# Patient Record
Sex: Female | Born: 1935 | Race: White | Hispanic: No | Smoking: Never smoker
Health system: Southern US, Community
[De-identification: ages and names within clinical notes are randomized; demographics above are authoritative.]

## PROBLEM LIST (undated history)

## (undated) DIAGNOSIS — E785 Hyperlipidemia, unspecified: Secondary | ICD-10-CM

## (undated) DIAGNOSIS — E46 Unspecified protein-calorie malnutrition: Secondary | ICD-10-CM

## (undated) DIAGNOSIS — C539 Malignant neoplasm of cervix uteri, unspecified: Secondary | ICD-10-CM

## (undated) DIAGNOSIS — K219 Gastro-esophageal reflux disease without esophagitis: Secondary | ICD-10-CM

## (undated) DIAGNOSIS — R768 Other specified abnormal immunological findings in serum: Secondary | ICD-10-CM

## (undated) DIAGNOSIS — E538 Deficiency of other specified B group vitamins: Secondary | ICD-10-CM

## (undated) DIAGNOSIS — F329 Major depressive disorder, single episode, unspecified: Secondary | ICD-10-CM

## (undated) DIAGNOSIS — K520 Gastroenteritis and colitis due to radiation: Secondary | ICD-10-CM

## (undated) DIAGNOSIS — G629 Polyneuropathy, unspecified: Principal | ICD-10-CM

## (undated) DIAGNOSIS — M810 Age-related osteoporosis without current pathological fracture: Secondary | ICD-10-CM

## (undated) DIAGNOSIS — D649 Anemia, unspecified: Secondary | ICD-10-CM

## (undated) DIAGNOSIS — F411 Generalized anxiety disorder: Secondary | ICD-10-CM

## (undated) DIAGNOSIS — N959 Unspecified menopausal and perimenopausal disorder: Secondary | ICD-10-CM

## (undated) DIAGNOSIS — K589 Irritable bowel syndrome without diarrhea: Secondary | ICD-10-CM

## (undated) DIAGNOSIS — Z8541 Personal history of malignant neoplasm of cervix uteri: Secondary | ICD-10-CM

## (undated) DIAGNOSIS — M5126 Other intervertebral disc displacement, lumbar region: Secondary | ICD-10-CM

## (undated) DIAGNOSIS — E739 Lactose intolerance, unspecified: Secondary | ICD-10-CM

## (undated) DIAGNOSIS — R778 Other specified abnormalities of plasma proteins: Principal | ICD-10-CM

## (undated) DIAGNOSIS — J309 Allergic rhinitis, unspecified: Secondary | ICD-10-CM

## (undated) DIAGNOSIS — F3289 Other specified depressive episodes: Secondary | ICD-10-CM

## (undated) DIAGNOSIS — M19079 Primary osteoarthritis, unspecified ankle and foot: Secondary | ICD-10-CM

## (undated) HISTORY — DX: Age-related osteoporosis without current pathological fracture: M81.0

## (undated) HISTORY — DX: Primary osteoarthritis, unspecified ankle and foot: M19.079

## (undated) HISTORY — DX: Personal history of malignant neoplasm of cervix uteri: Z85.41

## (undated) HISTORY — PX: ABDOMINAL HYSTERECTOMY: SHX81

## (undated) HISTORY — DX: Other specified depressive episodes: F32.89

## (undated) HISTORY — DX: Gastroenteritis and colitis due to radiation: K52.0

## (undated) HISTORY — DX: Allergic rhinitis, unspecified: J30.9

## (undated) HISTORY — DX: Anemia, unspecified: D64.9

## (undated) HISTORY — DX: Lactose intolerance, unspecified: E73.9

## (undated) HISTORY — PX: OTHER SURGICAL HISTORY: SHX169

## (undated) HISTORY — DX: Unspecified protein-calorie malnutrition: E46

## (undated) HISTORY — DX: Generalized anxiety disorder: F41.1

## (undated) HISTORY — DX: Other intervertebral disc displacement, lumbar region: M51.26

## (undated) HISTORY — DX: Other specified abnormal immunological findings in serum: R76.8

## (undated) HISTORY — DX: Deficiency of other specified B group vitamins: E53.8

## (undated) HISTORY — DX: Irritable bowel syndrome, unspecified: K58.9

## (undated) HISTORY — DX: Malignant neoplasm of cervix uteri, unspecified: C53.9

## (undated) HISTORY — PX: APPENDECTOMY: SHX54

## (undated) HISTORY — DX: Other specified abnormalities of plasma proteins: R77.8

## (undated) HISTORY — DX: Hyperlipidemia, unspecified: E78.5

## (undated) HISTORY — DX: Unspecified menopausal and perimenopausal disorder: N95.9

## (undated) HISTORY — DX: Major depressive disorder, single episode, unspecified: F32.9

## (undated) HISTORY — DX: Polyneuropathy, unspecified: G62.9

## (undated) HISTORY — DX: Gastro-esophageal reflux disease without esophagitis: K21.9

## (undated) HISTORY — PX: OOPHORECTOMY: SHX86

---

## 1998-04-02 ENCOUNTER — Ambulatory Visit (HOSPITAL_COMMUNITY): Admission: RE | Admit: 1998-04-02 | Discharge: 1998-04-02 | Payer: Self-pay | Admitting: Gastroenterology

## 1999-02-04 ENCOUNTER — Ambulatory Visit (HOSPITAL_COMMUNITY): Admission: RE | Admit: 1999-02-04 | Discharge: 1999-02-04 | Payer: Self-pay | Admitting: *Deleted

## 1999-04-23 ENCOUNTER — Ambulatory Visit (HOSPITAL_COMMUNITY): Admission: RE | Admit: 1999-04-23 | Discharge: 1999-04-23 | Payer: Self-pay | Admitting: Pediatrics

## 1999-04-23 ENCOUNTER — Encounter: Payer: Self-pay | Admitting: *Deleted

## 1999-05-05 ENCOUNTER — Encounter: Payer: Self-pay | Admitting: *Deleted

## 1999-05-05 ENCOUNTER — Ambulatory Visit (HOSPITAL_COMMUNITY): Admission: RE | Admit: 1999-05-05 | Discharge: 1999-05-05 | Payer: Self-pay | Admitting: *Deleted

## 1999-11-09 ENCOUNTER — Emergency Department (HOSPITAL_COMMUNITY): Admission: EM | Admit: 1999-11-09 | Discharge: 1999-11-09 | Payer: Self-pay | Admitting: Emergency Medicine

## 2000-02-29 ENCOUNTER — Encounter: Payer: Self-pay | Admitting: *Deleted

## 2000-02-29 ENCOUNTER — Ambulatory Visit (HOSPITAL_COMMUNITY): Admission: RE | Admit: 2000-02-29 | Discharge: 2000-02-29 | Payer: Self-pay | Admitting: *Deleted

## 2000-03-25 ENCOUNTER — Ambulatory Visit (HOSPITAL_COMMUNITY): Admission: RE | Admit: 2000-03-25 | Discharge: 2000-03-25 | Payer: Self-pay | Admitting: *Deleted

## 2000-03-25 ENCOUNTER — Encounter: Payer: Self-pay | Admitting: *Deleted

## 2000-03-30 ENCOUNTER — Encounter: Payer: Self-pay | Admitting: *Deleted

## 2000-03-30 ENCOUNTER — Ambulatory Visit (HOSPITAL_COMMUNITY): Admission: RE | Admit: 2000-03-30 | Discharge: 2000-03-30 | Payer: Self-pay | Admitting: *Deleted

## 2000-06-27 ENCOUNTER — Inpatient Hospital Stay (HOSPITAL_COMMUNITY): Admission: EM | Admit: 2000-06-27 | Discharge: 2000-07-22 | Payer: Self-pay | Admitting: Gastroenterology

## 2000-06-27 ENCOUNTER — Encounter: Payer: Self-pay | Admitting: Gastroenterology

## 2000-06-27 ENCOUNTER — Encounter (INDEPENDENT_AMBULATORY_CARE_PROVIDER_SITE_OTHER): Payer: Self-pay | Admitting: Specialist

## 2000-06-27 LAB — HM COLONOSCOPY

## 2000-06-28 ENCOUNTER — Encounter: Payer: Self-pay | Admitting: Gastroenterology

## 2000-07-01 ENCOUNTER — Encounter: Payer: Self-pay | Admitting: Internal Medicine

## 2000-07-06 ENCOUNTER — Encounter: Payer: Self-pay | Admitting: Gastroenterology

## 2000-07-08 ENCOUNTER — Encounter: Payer: Self-pay | Admitting: Gastroenterology

## 2000-07-09 ENCOUNTER — Encounter: Payer: Self-pay | Admitting: Gastroenterology

## 2000-07-10 ENCOUNTER — Encounter: Payer: Self-pay | Admitting: Gastroenterology

## 2000-07-11 ENCOUNTER — Encounter: Payer: Self-pay | Admitting: Orthopedic Surgery

## 2000-07-12 ENCOUNTER — Encounter: Payer: Self-pay | Admitting: Endocrinology

## 2000-07-14 ENCOUNTER — Encounter: Payer: Self-pay | Admitting: Endocrinology

## 2000-07-18 ENCOUNTER — Encounter: Payer: Self-pay | Admitting: Internal Medicine

## 2000-07-20 ENCOUNTER — Encounter: Payer: Self-pay | Admitting: Internal Medicine

## 2000-07-22 ENCOUNTER — Inpatient Hospital Stay
Admission: RE | Admit: 2000-07-22 | Discharge: 2000-08-09 | Payer: Self-pay | Admitting: Physical Medicine & Rehabilitation

## 2000-07-28 ENCOUNTER — Encounter: Payer: Self-pay | Admitting: Internal Medicine

## 2000-07-29 ENCOUNTER — Encounter: Payer: Self-pay | Admitting: Internal Medicine

## 2000-09-20 HISTORY — PX: OTHER SURGICAL HISTORY: SHX169

## 2001-01-02 ENCOUNTER — Inpatient Hospital Stay (HOSPITAL_COMMUNITY): Admission: EM | Admit: 2001-01-02 | Discharge: 2001-01-24 | Payer: Self-pay | Admitting: Gastroenterology

## 2001-01-02 ENCOUNTER — Encounter (INDEPENDENT_AMBULATORY_CARE_PROVIDER_SITE_OTHER): Payer: Self-pay

## 2001-01-02 ENCOUNTER — Encounter: Payer: Self-pay | Admitting: Gastroenterology

## 2001-01-03 ENCOUNTER — Encounter: Payer: Self-pay | Admitting: Gastroenterology

## 2001-01-08 ENCOUNTER — Encounter: Payer: Self-pay | Admitting: Gastroenterology

## 2001-01-09 ENCOUNTER — Encounter: Payer: Self-pay | Admitting: Internal Medicine

## 2001-01-10 ENCOUNTER — Encounter: Payer: Self-pay | Admitting: Internal Medicine

## 2001-01-11 ENCOUNTER — Encounter: Payer: Self-pay | Admitting: Internal Medicine

## 2001-01-12 ENCOUNTER — Encounter: Payer: Self-pay | Admitting: Gastroenterology

## 2001-01-24 ENCOUNTER — Inpatient Hospital Stay: Admission: RE | Admit: 2001-01-24 | Discharge: 2001-01-30 | Payer: Self-pay

## 2005-04-09 ENCOUNTER — Ambulatory Visit: Payer: Self-pay | Admitting: Endocrinology

## 2006-05-13 ENCOUNTER — Ambulatory Visit: Payer: Self-pay | Admitting: Endocrinology

## 2006-05-18 ENCOUNTER — Ambulatory Visit: Payer: Self-pay | Admitting: Internal Medicine

## 2006-07-04 ENCOUNTER — Ambulatory Visit: Payer: Self-pay | Admitting: Gastroenterology

## 2006-07-06 ENCOUNTER — Ambulatory Visit (HOSPITAL_COMMUNITY): Admission: RE | Admit: 2006-07-06 | Discharge: 2006-07-06 | Payer: Self-pay | Admitting: Gastroenterology

## 2006-07-25 ENCOUNTER — Ambulatory Visit: Payer: Self-pay | Admitting: Internal Medicine

## 2006-10-27 ENCOUNTER — Ambulatory Visit: Payer: Self-pay | Admitting: Internal Medicine

## 2007-06-14 ENCOUNTER — Encounter: Payer: Self-pay | Admitting: *Deleted

## 2007-06-14 DIAGNOSIS — K589 Irritable bowel syndrome without diarrhea: Secondary | ICD-10-CM | POA: Insufficient documentation

## 2007-06-14 DIAGNOSIS — K219 Gastro-esophageal reflux disease without esophagitis: Secondary | ICD-10-CM

## 2007-06-14 DIAGNOSIS — F411 Generalized anxiety disorder: Secondary | ICD-10-CM | POA: Insufficient documentation

## 2007-06-14 DIAGNOSIS — K520 Gastroenteritis and colitis due to radiation: Secondary | ICD-10-CM | POA: Insufficient documentation

## 2007-06-14 DIAGNOSIS — F329 Major depressive disorder, single episode, unspecified: Secondary | ICD-10-CM

## 2007-06-14 DIAGNOSIS — Z8541 Personal history of malignant neoplasm of cervix uteri: Secondary | ICD-10-CM | POA: Insufficient documentation

## 2007-06-14 DIAGNOSIS — E46 Unspecified protein-calorie malnutrition: Secondary | ICD-10-CM | POA: Insufficient documentation

## 2007-10-12 ENCOUNTER — Ambulatory Visit: Payer: Self-pay | Admitting: Internal Medicine

## 2007-10-12 DIAGNOSIS — D649 Anemia, unspecified: Secondary | ICD-10-CM

## 2007-10-12 DIAGNOSIS — J019 Acute sinusitis, unspecified: Secondary | ICD-10-CM

## 2007-10-12 DIAGNOSIS — M810 Age-related osteoporosis without current pathological fracture: Secondary | ICD-10-CM | POA: Insufficient documentation

## 2007-10-12 DIAGNOSIS — E739 Lactose intolerance, unspecified: Secondary | ICD-10-CM | POA: Insufficient documentation

## 2007-10-12 DIAGNOSIS — R5381 Other malaise: Secondary | ICD-10-CM | POA: Insufficient documentation

## 2007-10-12 DIAGNOSIS — R5383 Other fatigue: Secondary | ICD-10-CM

## 2007-10-12 DIAGNOSIS — J309 Allergic rhinitis, unspecified: Secondary | ICD-10-CM | POA: Insufficient documentation

## 2007-10-12 LAB — CONVERTED CEMR LAB
ALT: 21 units/L (ref 0–35)
AST: 20 units/L (ref 0–37)
Albumin: 3.7 g/dL (ref 3.5–5.2)
Alkaline Phosphatase: 53 units/L (ref 39–117)
BUN: 16 mg/dL (ref 6–23)
Basophils Absolute: 0 10*3/uL (ref 0.0–0.1)
Basophils Relative: 0.2 % (ref 0.0–1.0)
Bilirubin, Direct: 0.1 mg/dL (ref 0.0–0.3)
CO2: 28 meq/L (ref 19–32)
Calcium: 9.3 mg/dL (ref 8.4–10.5)
Chloride: 106 meq/L (ref 96–112)
Creatinine, Ser: 1 mg/dL (ref 0.4–1.2)
Eosinophils Absolute: 0.1 10*3/uL (ref 0.0–0.6)
Eosinophils Relative: 1.5 % (ref 0.0–5.0)
GFR calc Af Amer: 69 mL/min
GFR calc non Af Amer: 57 mL/min
Glucose, Bld: 102 mg/dL — ABNORMAL HIGH (ref 70–99)
HCT: 43.2 % (ref 36.0–46.0)
Hemoglobin: 14.6 g/dL (ref 12.0–15.0)
Lymphocytes Relative: 8.5 % — ABNORMAL LOW (ref 12.0–46.0)
MCHC: 33.9 g/dL (ref 30.0–36.0)
MCV: 90.3 fL (ref 78.0–100.0)
Monocytes Absolute: 0.7 10*3/uL (ref 0.2–0.7)
Monocytes Relative: 8.3 % (ref 3.0–11.0)
Neutro Abs: 6.7 10*3/uL (ref 1.4–7.7)
Neutrophils Relative %: 81.5 % — ABNORMAL HIGH (ref 43.0–77.0)
Platelets: 220 10*3/uL (ref 150–400)
Potassium: 4 meq/L (ref 3.5–5.1)
RBC: 4.78 M/uL (ref 3.87–5.11)
RDW: 13.1 % (ref 11.5–14.6)
Sodium: 140 meq/L (ref 135–145)
TSH: 0.68 microintl units/mL (ref 0.35–5.50)
Total Bilirubin: 0.5 mg/dL (ref 0.3–1.2)
Total Protein: 6.8 g/dL (ref 6.0–8.3)
WBC: 8.2 10*3/uL (ref 4.5–10.5)

## 2007-11-14 ENCOUNTER — Ambulatory Visit: Payer: Self-pay | Admitting: Internal Medicine

## 2007-11-14 DIAGNOSIS — R21 Rash and other nonspecific skin eruption: Secondary | ICD-10-CM | POA: Insufficient documentation

## 2009-05-16 ENCOUNTER — Telehealth: Payer: Self-pay | Admitting: Internal Medicine

## 2009-05-27 ENCOUNTER — Telehealth: Payer: Self-pay | Admitting: Internal Medicine

## 2009-05-28 ENCOUNTER — Ambulatory Visit: Payer: Self-pay | Admitting: Internal Medicine

## 2009-05-28 DIAGNOSIS — N959 Unspecified menopausal and perimenopausal disorder: Secondary | ICD-10-CM | POA: Insufficient documentation

## 2009-05-28 LAB — CONVERTED CEMR LAB
ALT: 22 units/L (ref 0–35)
AST: 27 units/L (ref 0–37)
Albumin: 3.6 g/dL (ref 3.5–5.2)
Alkaline Phosphatase: 58 units/L (ref 39–117)
BUN: 14 mg/dL (ref 6–23)
Basophils Absolute: 0.1 10*3/uL (ref 0.0–0.1)
Basophils Relative: 1.3 % (ref 0.0–3.0)
Bilirubin Urine: NEGATIVE
Bilirubin, Direct: 0.2 mg/dL (ref 0.0–0.3)
CO2: 29 meq/L (ref 19–32)
Calcium: 9.3 mg/dL (ref 8.4–10.5)
Chloride: 107 meq/L (ref 96–112)
Cholesterol: 191 mg/dL (ref 0–200)
Creatinine, Ser: 1.3 mg/dL — ABNORMAL HIGH (ref 0.4–1.2)
Eosinophils Absolute: 0.4 10*3/uL (ref 0.0–0.7)
Eosinophils Relative: 5.7 % — ABNORMAL HIGH (ref 0.0–5.0)
GFR calc non Af Amer: 41.68 mL/min (ref >60)
Glucose, Bld: 95 mg/dL (ref 70–99)
HCT: 42.1 % (ref 36.0–46.0)
HDL: 40.6 mg/dL (ref >39.00)
Hemoglobin, Urine: NEGATIVE
Hemoglobin: 13.9 g/dL (ref 12.0–15.0)
Hgb A1c MFr Bld: 5.3 % (ref 4.6–6.5)
Ketones, ur: NEGATIVE mg/dL
LDL Cholesterol: 130 mg/dL — ABNORMAL HIGH (ref 0–99)
Leukocytes, UA: NEGATIVE
Lymphocytes Relative: 8.3 % — ABNORMAL LOW (ref 12.0–46.0)
Lymphs Abs: 0.6 10*3/uL — ABNORMAL LOW (ref 0.7–4.0)
MCHC: 33 g/dL (ref 30.0–36.0)
MCV: 92 fL (ref 78.0–100.0)
Monocytes Absolute: 0.7 10*3/uL (ref 0.1–1.0)
Monocytes Relative: 9.9 % (ref 3.0–12.0)
Neutro Abs: 5.6 10*3/uL (ref 1.4–7.7)
Neutrophils Relative %: 74.8 % (ref 43.0–77.0)
Nitrite: NEGATIVE
Platelets: 166 10*3/uL (ref 150.0–400.0)
Potassium: 5 meq/L (ref 3.5–5.1)
RBC: 4.57 M/uL (ref 3.87–5.11)
RDW: 13.2 % (ref 11.5–14.6)
Sodium: 142 meq/L (ref 135–145)
Specific Gravity, Urine: >=1.03 (ref 1.000–1.030)
TSH: 0.9 microintl units/mL (ref 0.35–5.50)
Total Bilirubin: 0.9 mg/dL (ref 0.3–1.2)
Total CHOL/HDL Ratio: 5
Total Protein, Urine: NEGATIVE mg/dL
Total Protein: 6.7 g/dL (ref 6.0–8.3)
Triglycerides: 102 mg/dL (ref 0.0–149.0)
Urine Glucose: NEGATIVE mg/dL
Urobilinogen, UA: 0.2 (ref 0.0–1.0)
VLDL: 20.4 mg/dL (ref 0.0–40.0)
WBC: 7.4 10*3/uL (ref 4.5–10.5)
pH: 5 (ref 5.0–8.0)

## 2009-07-30 ENCOUNTER — Ambulatory Visit: Payer: Self-pay | Admitting: Internal Medicine

## 2010-01-01 ENCOUNTER — Ambulatory Visit: Payer: Self-pay | Admitting: Internal Medicine

## 2010-01-01 DIAGNOSIS — R209 Unspecified disturbances of skin sensation: Secondary | ICD-10-CM

## 2010-01-01 DIAGNOSIS — M19079 Primary osteoarthritis, unspecified ankle and foot: Secondary | ICD-10-CM | POA: Insufficient documentation

## 2010-01-23 ENCOUNTER — Encounter: Payer: Self-pay | Admitting: Internal Medicine

## 2010-01-26 ENCOUNTER — Telehealth: Payer: Self-pay | Admitting: Internal Medicine

## 2010-01-26 ENCOUNTER — Encounter: Payer: Self-pay | Admitting: Internal Medicine

## 2010-01-27 ENCOUNTER — Encounter: Payer: Self-pay | Admitting: Internal Medicine

## 2010-02-03 ENCOUNTER — Ambulatory Visit: Payer: Self-pay | Admitting: Internal Medicine

## 2010-02-03 DIAGNOSIS — M5126 Other intervertebral disc displacement, lumbar region: Secondary | ICD-10-CM | POA: Insufficient documentation

## 2010-09-15 ENCOUNTER — Telehealth: Payer: Self-pay | Admitting: Internal Medicine

## 2010-10-05 ENCOUNTER — Encounter: Payer: Self-pay | Admitting: Internal Medicine

## 2010-10-22 NOTE — Progress Notes (Signed)
Summary: Prolia Covered  Phone Note Other Incoming   Summary of Call: Prolia paperwork has been received and patient will have to pay $0 out of pocket. Patient notified and states that she will call back later for appt.   Patient is aware to give [place] time to order the inj for her. Initial call taken by: Lucious Groves,  Jan 26, 2010 11:33 AM  Follow-up for Phone Call        noted  thanks Follow-up by: Corwin Levins MD,  Jan 26, 2010 12:51 PM    New/Updated Medications: PROLIA 60 MG/ML SOLN (DENOSUMAB) asd subcutaneously q 6 mo

## 2010-10-22 NOTE — Medication Information (Signed)
Summary: Summary of Benefits for Prolia/ProliaPlus  Summary of Benefits for Prolia/ProliaPlus   Imported By: Sherian Rein 01/28/2010 11:39:35  _____________________________________________________________________  External Attachment:    Type:   Image     Comment:   External Document

## 2010-10-22 NOTE — Medication Information (Signed)
Summary: Insurance form/ProliaPlus  Insurance form/ProliaPlus   Imported By: Lester [place] 02/11/2010 09:16:31  _____________________________________________________________________  External Attachment:    Type:   Image     Comment:   External Document

## 2010-10-22 NOTE — Consult Note (Signed)
Summary: [facility]  [facility]   Imported By: Lennie Odor 10/14/2010 14:57:55  _____________________________________________________________________  External Attachment:    Type:   Image     Comment:   External Document

## 2010-10-22 NOTE — Assessment & Plan Note (Signed)
Summary: LEG FEEL LIKE PINS AND NEEDLES--CRAMPS--STC   Vital Signs:  Patient profile:   77 year old female Height:      62 inches Weight:      138.75 pounds BMI:     25.47 O2 Sat:      91 % on Room air Temp:     98.1 degrees F oral Pulse rate:   62 / minute BP sitting:   142 / 90  (left arm) Cuff size:   regular  Vitals Entered ByZella Ball Ewing (Feb 03, 2010 2:53 PM)  O2 Flow:  Room air CC: Legs and feet numbness, back pain/RE   CC:  Legs and feet numbness and back pain/RE.  History of Present Illness: here to f/u - no change from last HPI last visit in history regarding the LE discomfort which persists, constant, and now s/p LE EMG/NCS proving most likely due to bilat L5 radiculopathies; still with assoc back pain no change as well;  incidently place on lyrica 25 mg at bedtime per Dr Demetrius Charity. [place]/psychiatry recently but not affected her discomfort at night - does seem to tolerate well, however.  No balance issue or fall.  Incidently also today with 3 days facial pain, pressure, fever and greenish d/c, on top of several months nasal allergy symtpoms with midl congestion, sneeze and itch.  Pt denies CP, sob, doe, wheezing, orthopnea, pnd, worsening LE edema, palps, dizziness or syncope  Pt denies other new neuro symptoms such as headache, facial or extremity weakness   Problems Prior to Update: 1)  Preventive Health Care  (ICD-V70.0) 2)  Degenerative Joint Disease, Ankle  (ICD-715.97) 3)  Paresthesia  (ICD-782.0) 4)  Menopausal Disorder  (ICD-627.9) 5)  Fatigue  (ICD-780.79) 6)  Rash-nonvesicular  (ICD-782.1) 7)  Allergic Rhinitis  (ICD-477.9) 8)  Osteoporosis  (ICD-733.00) 9)  Anemia-nos  (ICD-285.9) 10)  Ibs  (ICD-564.1) 11)  Glucose Intolerance  (ICD-271.3) 12)  Fatigue  (ICD-780.79) 13)  Sinusitis- Acute-nos  (ICD-461.9) 14)  Gastroenteritis/colitis D/t Radiation  (ICD-558.1) 15)  Malnutrition  (ICD-263.9) 16)  Ibs  (ICD-564.1) 17)  Gerd  (ICD-530.81) 18)  Depression   (ICD-311) 19)  Cervical Cancer, Hx of  (ICD-V10.41) 20)  Anxiety  (ICD-300.00)  Medications Prior to Update: 1)  Lorazepam 0.5 Mg  Tabs (Lorazepam) .... Take 1 By Mouth Three Times A Day 2)  Meclizine Hcl 12.5 Mg Tabs (Meclizine Hcl) .... Take 1 Tablet By Mouth Four Times A Day As Needed Dizzy 3)  Omeprazole 20 Mg  Cpdr (Omeprazole) .... 2 By Mouth Once Daily 4)  Citalopram Hydrobromide 20 Mg Tabs (Citalopram Hydrobromide) .Marland Kitchen.. 1 By Mouth Once Daily 5)  Amitriptyline Hcl 100 Mg Tabs (Amitriptyline Hcl) .Marland Kitchen.. 1po At Bedtime 6)  Cetirizine Hcl 10 Mg Tabs (Cetirizine Hcl) .Marland Kitchen.. 1po Once Daily 7)  Lyrica 25 Mg Caps (Pregabalin) .Marland Kitchen.. 1 By Mouth Once Daily 8)  Oxycodone-Acetaminophen 5-325 Mg Tabs (Oxycodone-Acetaminophen) .Marland Kitchen.. 1 By Mouth Two Times A Day 9)  Nitrofurantoin Macrocrystal 100 Mg Caps (Nitrofurantoin Macrocrystal) .Marland Kitchen.. 1po At Bedtime 10)  Clotrimazole-Betamethasone 1-0.05 % Crea (Clotrimazole-Betamethasone) .... Use Asd Two Times A Day As Needed 11)  Celebrex 200 Mg Caps (Celecoxib) .Marland Kitchen.. 1po Two Times A Day As Needed 12)  Prolia 60 Mg/ml Soln (Denosumab) .... Asd Subcutaneously Q 6 Mo  Current Medications (verified): 1)  Lorazepam 0.5 Mg  Tabs (Lorazepam) .... Take 1 By Mouth Three Times A Day 2)  Meclizine Hcl 12.5 Mg Tabs (Meclizine Hcl) .... Take 1 Tablet By Mouth  Four Times A Day As Needed Dizzy 3)  Omeprazole 20 Mg  Cpdr (Omeprazole) .... 2 By Mouth Once Daily 4)  Citalopram Hydrobromide 20 Mg Tabs (Citalopram Hydrobromide) .Marland Kitchen.. 1 By Mouth Once Daily 5)  Amitriptyline Hcl 100 Mg Tabs (Amitriptyline Hcl) .Marland Kitchen.. 1po At Bedtime 6)  Cetirizine Hcl 10 Mg Tabs (Cetirizine Hcl) .Marland Kitchen.. 1po Once Daily 7)  Lyrica 50 Mg Caps (Pregabalin) .Marland Kitchen.. 1po Two Times A Day 8)  Nitrofurantoin Macrocrystal 100 Mg Caps (Nitrofurantoin Macrocrystal) .Marland Kitchen.. 1po At Bedtime 9)  Clotrimazole-Betamethasone 1-0.05 % Crea (Clotrimazole-Betamethasone) .... Use Asd Two Times A Day As Needed 10)  Celebrex 200 Mg Caps  (Celecoxib) .Marland Kitchen.. 1po Two Times A Day As Needed 11)  Prolia 60 Mg/ml Soln (Denosumab) .... Asd Subcutaneously Q 6 Mo 12)  Azithromycin 250 Mg Tabs (Azithromycin) .... 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   Head:  normocephalic and atraumatic.   Eyes:  vision grossly intact, pupils equal, and pupils round.   Ears:  R ear normal and L ear normal.   Nose:  no external deformity and no nasal discharge.   Mouth:  no gingival abnormalities and pharynx pink and moist.   Neck:  supple and no masses.   Lungs:  normal respiratory effort and normal breath sounds.   Heart:  normal rate and regular rhythm.   Msk:  no joint tenderness and no joint swelling.  , no spine tender Extremities:  no edema, no erythema  Neurologic:  alert & oriented X3 and cranial nerves II-XII intact.  with decreased sensation to LT dorsal feet and prox legs to mid calf bialt, motor normal, DTR ok   Impression & Recommendations:  Problem # 1:  DEGENERATIVE DISC DISEASE, LUMBOSACRAL SPINE W/RADICULOPATHY (ICD-722.10) with stable exam but persistent pain;  sees ortho for the back but as she tolerates the lower  dose lyrica at night, will try increase to 50 two times a day lyrica to help with neuritic pain as well;  Dr Nolen Mu to be notified of change  Problem # 2:  SINUSITIS- ACUTE-NOS (ICD-461.9)  Her updated medication list for this problem includes:    Azithromycin 250 Mg Tabs (Azithromycin) .Marland Kitchen... 2po qd for 1 day, then 1po qd for 4days, then stop treat as above, f/u any worsening signs or symptoms   Problem # 3:  ALLERGIC RHINITIS (ICD-477.9)  Her updated medication list for this problem includes:    Cetirizine Hcl 10 Mg Tabs (Cetirizine hcl) .Marland Kitchen... 1po once daily treat as above, f/u any worsening signs or symptoms  - no change  Problem # 4:  ANXIETY (ICD-300.00)  Her updated medication list for this problem includes:    Lorazepam 0.5 Mg Tabs (Lorazepam) .Marland Kitchen... Take 1 by mouth three times  a day    Citalopram Hydrobromide 20 Mg Tabs (Citalopram hydrobromide) .Marland Kitchen... 1 by mouth once daily    Amitriptyline Hcl 100 Mg Tabs (Amitriptyline hcl) .Marland Kitchen... 1po at bedtime chronic persistent , followed per Dr Nolen Mu  Complete Medication List: 1)  Lorazepam 0.5 Mg Tabs (Lorazepam) .... Take 1 by mouth three times a day 2)  Meclizine Hcl 12.5 Mg Tabs (Meclizine hcl) .... Take 1 tablet by mouth four times a day as needed dizzy 3)  Omeprazole 20 Mg Cpdr (Omeprazole) .... 2 by mouth once daily 4)  Citalopram Hydrobromide 20 Mg Tabs (Citalopram hydrobromide) .Marland Kitchen.. 1 by mouth once daily 5)  Amitriptyline Hcl 100 Mg Tabs (Amitriptyline hcl) .Marland Kitchen.. 1po at bedtime 6)  Cetirizine Hcl 10 Mg Tabs (Cetirizine hcl) .Marland Kitchen.. 1po once daily 7)  Lyrica 50 Mg Caps (Pregabalin) .Marland Kitchen.. 1po two times a day 8)  Nitrofurantoin Macrocrystal 100 Mg Caps (Nitrofurantoin macrocrystal) .Marland Kitchen.. 1po at bedtime 9)  Clotrimazole-betamethasone 1-0.05 % Crea (Clotrimazole-betamethasone) .... Use asd two times a day as needed 10)  Celebrex 200 Mg Caps (Celecoxib) .Marland Kitchen.. 1po two times a day as needed 11)  Prolia 60 Mg/ml Soln (Denosumab) .... Asd  subcutaneously q 6 mo 12)  Azithromycin 250 Mg Tabs (Azithromycin) .... 2po qd for 1 day, then 1po qd for 4days, then stop  Patient Instructions: 1)  increase the lyrica to 50 mg twice per day 2)  Please take all new medications as prescribed 3)  Continue all previous medications as before this visit  4)  You can also use Mucinex OTC or it's generic for congestion  5)  Please schedule a follow-up appointment as needed. Prescriptions: AZITHROMYCIN 250 MG TABS (AZITHROMYCIN) 2po qd for 1 day, then 1po qd for 4days, then stop  #6 x 1   Entered and Authorized by:   Corwin Levins MD   Signed by:   Corwin Levins MD on 02/03/2010   Method used:   Print then Give to Patient   RxID:   [id number] LYRICA 50 MG CAPS (PREGABALIN) 1po two times a day  #60 x 5   Entered and Authorized by:   Corwin Levins MD   Signed by:   Corwin Levins MD on 02/03/2010   Method used:   Print then Give to Patient   RxID:   [id number]

## 2010-10-22 NOTE — Assessment & Plan Note (Signed)
Summary: PRICKLY NUMB LEGS AND FEET--STC   Vital Signs:  Patient profile:   77 year old female Height:      62 inches Weight:      138 pounds BMI:     25.33 O2 Sat:      95 % on Room air Temp:     98.5 degrees F oral Pulse rate:   60 / minute BP sitting:   130 / 70  (left arm) Cuff size:   regular  Vitals Entered ByZella Ball Ewing (January 01, 2010 3:11 PM)  O2 Flow:  Room air  Preventive Care Screening  Bone Density:    Date:  05/21/2009    Next Due:  05/2011    Results:  abnormal std dev  Last Flu Shot:    Date:  06/20/2009    Results:  given      declines further colonoscopy  CC: legs and feet numb/RE   CC:  legs and feet numb/RE.  History of Present Illness: s/p recent UTI tx with cipro;  c/o increased numbness to both legs, feet and legs over the last 3 months without increased LBP, or LE weakness or numbness;  caused some increased fatigue she thinks; has known scoliosis and sees [facility] for [facility] lower back with chronic pain;  the recent lyrica 25 at bedtime has helped;  Pt denies CP, sob, doe, wheezing, orthopnea, pnd, worsening LE edema, palps, dizziness or syncope  Pt denies new other neuro symptoms such as headache, facial or extremity weakness, falls, bowel or bladder changes, fever, night sweats.    Overall denies worsening depressive symptoms, suicidal ideation, increased anxiety or panic.  Overall good med complaicne and tolerance.   Does c/o ongoing LBP, but also more specific 2 to 3 wks increased left ankle pain and swelling, mild , as well as the numbness above, walks with cane today.    Here for wellness Diet: Heart Healthy or DM if diabetic Physical Activities: Sedentary Depression/mood screen: chronic depression, followed per psychiatry Hearing: mild decreased bilateral Visual Acuity: Grossly mild blurred, gets exam yearly, due for cataracts surgury soon after recent exam ADL's: Capable fully Fall Risk: Mild, walks with cane in the house Home Safety:  Good Cognitive Impairment:  Gen appearance, affect, speech, memory, attention & motor skills grossly intact End-of-Life Planning: Advance directive - Full code/I agree   Preventive Screening-Counseling & Management      Drug Use:  no.    Problems Prior to Update: 1)  Degenerative Joint Disease, Ankle  (ICD-715.97) 2)  Paresthesia  (ICD-782.0) 3)  Menopausal Disorder  (ICD-627.9) 4)  Fatigue  (ICD-780.79) 5)  Rash-nonvesicular  (ICD-782.1) 6)  Allergic Rhinitis  (ICD-477.9) 7)  Osteoporosis  (ICD-733.00) 8)  Anemia-nos  (ICD-285.9) 9)  Ibs  (ICD-564.1) 10)  Glucose Intolerance  (ICD-271.3) 11)  Fatigue  (ICD-780.79) 12)  Sinusitis- Acute-nos  (ICD-461.9) 13)  Gastroenteritis/colitis D/t Radiation  (ICD-558.1) 14)  Malnutrition  (ICD-263.9) 15)  Ibs  (ICD-564.1) 16)  Gerd  (ICD-530.81) 17)  Depression  (ICD-311) 18)  Cervical Cancer, Hx of  (ICD-V10.41) 19)  Anxiety  (ICD-300.00)  Medications Prior to Update: 1)  Darvocet-N 100 100-650 Mg Tabs (Propoxyphene N-Apap) .Marland Kitchen.. 1 By Mouth Four Times Per Day As Needed For Pain 2)  Lorazepam 0.5 Mg  Tabs (Lorazepam) .... Take 1 By Mouth Three Times A Day 3)  Meclizine Hcl 12.5 Mg Tabs (Meclizine Hcl) .... Take 1 Tablet By Mouth Four Times A Day As Needed Dizzy 4)  Omeprazole  20 Mg  Cpdr (Omeprazole) .... 2 By Mouth Once Daily 5)  Citalopram Hydrobromide 20 Mg Tabs (Citalopram Hydrobromide) .Marland Kitchen.. 1 By Mouth Once Daily 6)  Cephalexin 500 Mg Caps (Cephalexin) .Marland Kitchen.. 1 By Mouth Three Times A Day 7)  Amitriptyline Hcl 100 Mg Tabs (Amitriptyline Hcl) .Marland Kitchen.. 1po At Bedtime 8)  Cetirizine Hcl 10 Mg Tabs (Cetirizine Hcl) .Marland Kitchen.. 1po Once Daily  Current Medications (verified): 1)  Lorazepam 0.5 Mg  Tabs (Lorazepam) .... Take 1 By Mouth Three Times A Day 2)  Meclizine Hcl 12.5 Mg Tabs (Meclizine Hcl) .... Take 1 Tablet By Mouth Four Times A Day As Needed Dizzy 3)  Omeprazole 20 Mg  Cpdr (Omeprazole) .... 2 By Mouth Once Daily 4)  Citalopram Hydrobromide  20 Mg Tabs (Citalopram Hydrobromide) .Marland Kitchen.. 1 By Mouth Once Daily 5)  Amitriptyline Hcl 100 Mg Tabs (Amitriptyline Hcl) .Marland Kitchen.. 1po At Bedtime 6)  Cetirizine Hcl 10 Mg Tabs (Cetirizine Hcl) .Marland Kitchen.. 1po Once Daily 7)  Lyrica 25 Mg Caps (Pregabalin) .Marland Kitchen.. 1 By Mouth Once Daily 8)  Oxycodone-Acetaminophen 5-325 Mg Tabs (Oxycodone-Acetaminophen) .Marland Kitchen.. 1 By Mouth Two Times A Day 9)  Nitrofurantoin Macrocrystal 100 Mg Caps (Nitrofurantoin Macrocrystal) .Marland Kitchen.. 1po At Bedtime 10)  Clotrimazole-Betamethasone 1-0.05 % Crea (Clotrimazole-Betamethasone) .... Use Asd Two Times A Day As Needed 11)  Celebrex 200 Mg Caps (Celecoxib) .Marland Kitchen.. 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 Hysterectomy (L) Hip Fracture s/p sb resection and stricturoplasty 2002 Oophorectomy Appendectomy  Social History: Reviewed history from 10/12/2007 and no changes required. 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         all otherwise negative per pt -    Physical Exam  General:  alert and underweight appearing.   Head:  normocephalic and atraumatic.   Eyes:  vision grossly intact, pupils equal, and pupils round.   Ears:  R ear normal and L ear normal.   Nose:  no external deformity and no nasal discharge.   Mouth:  no gingival abnormalities and pharynx pink and moist.   Neck:  supple and no masses.   Lungs:  normal respiratory effort and normal breath sounds.   Heart:  normal rate and regular rhythm.   Abdomen:  soft, non-tender, and normal bowel sounds.   Msk:  no acute joint tenderness and no joint swelling., except for left ankle mild effusion and mild tender but overall mild decreased mild ROM Extremities:  no edema, no erythema  Neurologic:  alert & oriented X3 and cranial nerves II-XII intact.  with decreased sensation to LT dorsal feet and prox legs to  mid calf bialt, motor normal, DTR ok Skin:  color normal and no rashes.   Psych:  depressed affect and moderately anxious.     Impression & Recommendations:  Problem # 1:  Preventive Health Care (ICD-V70.0)  Overall doing well, age appropriate education and counseling updated and referral for appropriate preventive services done unless declined, immunizations up to date or declined, diet counseling done if overweight, urged to quit smoking if smokes , most recent labs reviewed and current ordered if appropriate, ecg reviewed or declined (interpretation per ECG scanned in the EMR if done); information regarding Medicare Prevention requirements given if appropriate   Orders: First annual wellness visit with prevention plan  ([id number])  Problem # 2:  OSTEOPOROSIS (ICD-733.00) most recent dxa reviewd with pt;  consider prolia - will look into the cost copay for pt, and she will consider  Problem # 3:  DEPRESSION (ICD-311)  Her updated medication list for  this problem includes:    Lorazepam 0.5 Mg Tabs (Lorazepam) .Marland Kitchen... Take 1 by mouth three times a day    Citalopram Hydrobromide 20 Mg Tabs (Citalopram hydrobromide) .Marland Kitchen... 1 by mouth once daily    Amitriptyline Hcl 100 Mg Tabs (Amitriptyline hcl) .Marland Kitchen... 1po at bedtime stable overall by hx and exam, ok to continue meds/tx as is - to f/u with psych - dr Ladoris Gene  Problem # 4:  PARESTHESIA (ICD-782.0)  to check EMG/NCS - suspect periph neuropathy, consider neuro evalaution  Orders: Misc. Referral (Misc. Ref)  Problem # 5:  DEGENERATIVE JOINT DISEASE, ANKLE (ICD-715.97)  The following medications were removed from the medication list:    Darvocet-n 100 100-650 Mg Tabs (Propoxyphene n-apap) .Marland Kitchen... 1 by mouth four times per day as needed for pain Her updated medication list for this problem includes:    Oxycodone-acetaminophen 5-325 Mg Tabs (Oxycodone-acetaminophen) .Marland Kitchen... 1 by mouth two times a day    Celebrex 200 Mg Caps (Celecoxib) .Marland Kitchen... 1po two times a day as needed left ankle, to add the celebrex as needed   Orders: Prescription Created Electronically ([id number])  Complete Medication List: 1)  Lorazepam 0.5 Mg Tabs (Lorazepam) .... Take 1 by mouth three times a day 2)  Meclizine Hcl 12.5 Mg Tabs (Meclizine hcl) .... Take 1 tablet by mouth four times a day as needed dizzy 3)  Omeprazole 20 Mg Cpdr (Omeprazole) .... 2 by mouth once daily 4)  Citalopram Hydrobromide 20 Mg Tabs (Citalopram hydrobromide) .Marland Kitchen.. 1 by mouth once daily 5)  Amitriptyline Hcl 100 Mg Tabs (Amitriptyline hcl) .Marland Kitchen.. 1po at bedtime 6)  Cetirizine Hcl 10 Mg Tabs (Cetirizine hcl) .Marland Kitchen.. 1po once daily 7)  Lyrica 25 Mg Caps (Pregabalin) .Marland Kitchen.. 1 by mouth once daily 8)  Oxycodone-acetaminophen 5-325 Mg Tabs (Oxycodone-acetaminophen) .Marland Kitchen.. 1 by mouth two times a day 9)  Nitrofurantoin Macrocrystal 100 Mg Caps (Nitrofurantoin macrocrystal) .Marland Kitchen.. 1po at bedtime 10)  Clotrimazole-betamethasone 1-0.05 % Crea (Clotrimazole-betamethasone)  .... Use asd two times a day as needed 11)  Celebrex 200 Mg Caps (Celecoxib) .Marland Kitchen.. 1po two times a day as needed  Patient Instructions: 1)  You will be contacted about the referral(s) to: Nerve test for the legs 2)  Please take all new medications as prescribed  - the celebrex 3)  Continue all previous medications as before this visit  4)  Please keep all of your specialist appts including Dr Nolen Mu, Dr Loleta Chance and Dr Vernie Ammons 5)  please call [facility] on [address] for your yearly mammogram 6)  Please schedule a follow-up appointment in  6 months. Prescriptions: CELEBREX 200 MG CAPS (CELECOXIB) 1po two times a day as needed  #60 x 5   Entered and Authorized by:   Corwin Levins MD   Signed by:   Corwin Levins MD on 01/01/2010   Method used:   Print then Give to Patient   RxID:   [id number]

## 2010-10-22 NOTE — Progress Notes (Signed)
Summary: referral  Phone Note Call from Patient Call back at Home Phone [phone]   Caller: Patient Call For: Corwin Levins MD Summary of Call: pt requests referral to a Neurologist. Please advise Initial call taken by: Verdell Face,  September 15, 2010 2:52 PM  Follow-up for Phone Call        ok  - done per emr Follow-up by: Corwin Levins MD,  September 15, 2010 5:11 PM  Additional Follow-up for Phone Call Additional follow up Details #1::        pt advised via VM that requested referral has been placed and she will be contacted by [facility] with appt info. Additional Follow-up by: Margaret Pyle, CMA,  September 16, 2010 9:13 AM

## 2010-12-17 ENCOUNTER — Encounter: Payer: Medicare Other | Attending: Physical Medicine & Rehabilitation

## 2010-12-17 ENCOUNTER — Ambulatory Visit: Payer: Medicare Other | Admitting: Physical Medicine & Rehabilitation

## 2010-12-17 DIAGNOSIS — M25519 Pain in unspecified shoulder: Secondary | ICD-10-CM | POA: Insufficient documentation

## 2010-12-17 DIAGNOSIS — M79609 Pain in unspecified limb: Secondary | ICD-10-CM | POA: Insufficient documentation

## 2010-12-17 DIAGNOSIS — M48061 Spinal stenosis, lumbar region without neurogenic claudication: Secondary | ICD-10-CM | POA: Insufficient documentation

## 2010-12-17 DIAGNOSIS — F341 Dysthymic disorder: Secondary | ICD-10-CM | POA: Insufficient documentation

## 2010-12-17 DIAGNOSIS — IMO0002 Reserved for concepts with insufficient information to code with codable children: Secondary | ICD-10-CM

## 2010-12-17 DIAGNOSIS — IMO0001 Reserved for inherently not codable concepts without codable children: Secondary | ICD-10-CM | POA: Insufficient documentation

## 2010-12-17 DIAGNOSIS — R209 Unspecified disturbances of skin sensation: Secondary | ICD-10-CM | POA: Insufficient documentation

## 2011-01-12 ENCOUNTER — Encounter: Payer: Medicare Other | Attending: Physical Medicine & Rehabilitation

## 2011-01-12 ENCOUNTER — Ambulatory Visit: Payer: Medicare Other | Admitting: Physical Medicine & Rehabilitation

## 2011-01-12 DIAGNOSIS — M79609 Pain in unspecified limb: Secondary | ICD-10-CM | POA: Insufficient documentation

## 2011-01-12 DIAGNOSIS — M48061 Spinal stenosis, lumbar region without neurogenic claudication: Secondary | ICD-10-CM | POA: Insufficient documentation

## 2011-01-12 DIAGNOSIS — IMO0001 Reserved for inherently not codable concepts without codable children: Secondary | ICD-10-CM | POA: Insufficient documentation

## 2011-01-12 DIAGNOSIS — R209 Unspecified disturbances of skin sensation: Secondary | ICD-10-CM | POA: Insufficient documentation

## 2011-01-12 DIAGNOSIS — M25519 Pain in unspecified shoulder: Secondary | ICD-10-CM | POA: Insufficient documentation

## 2011-01-12 DIAGNOSIS — F341 Dysthymic disorder: Secondary | ICD-10-CM | POA: Insufficient documentation

## 2011-01-13 NOTE — Assessment & Plan Note (Signed)
ACCOUNT NUMBER:  [id number].  BRIEF HISTORY:  This 77 year old female was seen originally by Dr. Wynn Banker over a month ago for bilateral leg pain.  The patient comes in today stating that nothing is really new, she has some paresthesias which she describes as a numbing sensation in her legs.  She does have some problems with her hands from time to time in her left shoulder but it is mainly her lower extremities.  She states nothing has changed. She rates her average pain around 7 or 8.  General activity level is about 6-8.  Most activities aggravate her pain, is worse at night.  Her sleep patterns are fair and she gets relief with heat and ice.  She is taking a fair amount of medication from the referral doctor.  She is on Pristiq and Cymbalta as well as lorazepam and gabapentin.  She states she did not feel like any of it helps and she is requesting narcotics today.  Her mobility, she walks without assistance most of the time.  She does drive.  She is not employed.  She has issues with numbness and tingling and paresthesias in her lower extremities.  She is dizzy from time to time, trouble walking.  She has depression, anxiety, and weakness.  REVIEW OF SYSTEMS:  Otherwise only notable for some lower extremity edema.  SOCIAL HISTORY:  She is widowed.  She lives alone.  FAMILY HISTORY:  Unchanged.  PHYSICAL EXAMINATION:  VITAL SIGNS:  Blood pressure 172/77, pulse 64, respirations 18, O2 sats 96 on room air. NEUROLOGIC:  Her motor strength appears to be 5/5 in the lower extremities bilaterally but she gives away to pain on both, especially with iliopsoas, quadriceps.  She is limited somewhat with range of motion in the upper and lower extremities.  She is slow to rise.  She has somewhat of an altered gait due to instability.  Constitutionally, she is thin and appears normal.  She is alert and oriented x3, but she appears somewhat depressed.  ASSESSMENT:  Chronic pain syndrome with  lumbar spinal stenosis, fibromyalgia, and shoulder pain.  PLAN:  Dr. Wynn Banker had recommended that she go to physical therapy and aquatic therapy for the fibromyalgia pain, increase her activity.  She states she does not want to do that, she rather just take some medicines.  I declined to give her any narcotics today and told her also given the nature of her pain, activity would be best for her at this point if she would follow through with the therapies and she states she may go get a massage but that is all she is going to do.  She will follow up with her other doctors as scheduled.  Her questions were encouraged and answered.     Treson Laura L. Blima Dessert    RLW/MedQ D:  01/12/2011 13:36:59  T:  01/13/2011 01:17:27  Job #:  [id number]

## 2011-01-19 ENCOUNTER — Ambulatory Visit: Payer: Medicare Other | Admitting: Physical Medicine & Rehabilitation

## 2011-02-05 NOTE — Procedures (Signed)
[facility]  Patient:    [name], [name]                      MRN: [id number] Proc. Date: 06/30/00 Adm. Date:  [id number] Attending:  Charmaine Downs CC:         Ulyess Mort, M.D. [facility]   Procedure Report  PROCEDURE:  Esophagogastroduodenoscopy with biopsies.  SURGEON:  Wilhemina Bonito. Eda Keys., M.D.  INDICATIONS:  Abdominal pain and weight loss.  HISTORY:  This is a 77 year old female with a remote history of cervical cancer for which she underwent radiation therapy.  She has had chronic postprandial abdominal discomfort which occurs intermittently and has been associated with marked weight loss.  She was admitted to the hospital with evidence of malnutrition.  She just completed colonoscopy.  See that dictation.  She is now for upper endoscopy.  PHYSICAL EXAMINATION:  Completed prior to colonoscopy.  See that dictation.  DESCRIPTION OF PROCEDURE:  After completing colonoscopy, the patient remained in the left lateral decubitus position.  No additional sedation was required. The Olympus endoscope was passed orally under direct vision into the esophagus.  The esophagus was normal.  The stomach was normal with the exception of small benign inflammatory-appearing polyps.  The duodenal bulb and postbulbar duodenum to the third portion appeared normal.  Multiple biopsies of the third portion of the duodenum were taken and submitted for pathologic analysis.  IMPRESSION:  No significant pathology on esophagogastroduodenoscopy.  RECOMMENDATIONS: 1. Followup biopsies. 2. Small bowel follow through in a.m. DD:  06/30/00 TD:  07/02/00 Job: [id number]

## 2011-02-05 NOTE — H&P (Signed)
[facility]  Patient:    [name], [name]                      MRN: [id number] Adm. Date:  [id number] Attending:  Charmaine Downs Dictator:   Mike Gip, P.A.-C. CC:         Ulyess Mort, M.D. [facility]   History and Physical  CHIEF COMPLAINT:  Malnutrition.  HISTORY:  Mrs. Matuszak is a 77 year old white female with history of radiation enteritis, malnutrition, anxiety and depression, a remote history of cervical cancer with radiation, has history of GERD and IBS, and is status post total abdominal hysterectomy/BSO.  She was last admitted on June 27, 2000 through July 22, 2000 with malnutrition and weight of 75 pounds, with a normal weight apparently of 140 approximately two years ago.  She had had extensive workup during that admission with small-bowel follow-through which showed mid-to-distal jejunum with moderate dilation and transition to more normal distal ileum, consistent with radiation enteritis.  There was no focal stricture noted.  CT of the abdomen and pelvis showed resolving partial small-bowel obstruction, slightly prominent common bile duct and multiple probable liver and renal cysts.  Barium enema showed probable radiation changes in the sigmoid.  Upper endoscopy on June 30, 2000, per Dr. Wilhemina Bonito. Eda Keys., was normal, including biopsies of the duodenum, and colonoscopy during that same admission was also normal, with biopsies of the terminal ileum unremarkable.  Patient required tube feedings during that admission as well as TNA and her course was complicated by a hospital-acquired pneumonia and then a left hip fracture from a fall.  She eventually was transferred to rehab and had TNA for a total of three to four weeks.  She was only seen in our office one time since then, weighing approximately 85 pounds. She then no-showed for two appointments and is now referred back by Dr. Jonny Ruiz. She says she is followed regularly  by Dr. Charlies Silvers for psychiatry and is on Remeron and lorazepam.  Her records also show that she was on Celexa; however, she does not mention this.  She is also uncertain of the dose of Remeron.  Her weight at this time is back to 75 pounds fully dressed in the office.  She complains of having no energy.  She lives alone and says she has primarily been eating soup and crackers in small amounts and not much else. She says she cannot get the supplements down and does not like the taste.  She complains of not having any appetite and having occasional nausea and vomiting.  No dysphagia or odynophagia.  She does have some intermittent abdominal discomfort and bloating and has loose stools at home, again intermittently.  No melena or any heme.  She also complains of numbness in her feet and toes and swelling in her legs and some "staggering" at home as well as poor memory.  She admits to depression but is a very vague historian and will not elaborate any specific symptoms.  Her sisters apparently check on her periodically but not daily.  She is admitted from our office at this time with malnutrition and dehydration, for further diagnostic workup and enteral feedings.  CURRENT MEDICATIONS: 1. Levbid one p.o. b.i.d. 2. Evista 60 mg q.d. 3. Lorazepam 0.5 mg b.i.d. 4. Phenergan b.i.d. p.r.n. 5. Lasix 40 mg q.d. 6. Klor-Con 10 q.d. 7. Pepcid AC p.r.n. 8. Remeron 30 mg q.h.s.  Again, patient did not mention Celexa and  does not know doses.  ALLERGIES:  No known drug allergies.  PAST HISTORY:  As outlined above.  SOCIAL HISTORY:  The patient is widowed, lives alone.  Sisters are somewhat supportive.  No ETOH.  No tobacco.  FAMILY HISTORY:  Family history is negative for GI disease.  REVIEW OF SYSTEMS:  CARDIOVASCULAR:  Denies any chest pain or anginal symptoms.  PULMONARY:  Denies cough, shortness of breath or sputum production. GENITOURINARY:  Negative for dysuria, urgency or  frequency.  GI:  As above.  PHYSICAL EXAMINATION:  GENERAL:  Well-developed, frail, very cachectic-appearing white female in no acute distress.  She is 75 pounds dressed.  VITAL SIGNS:  Blood pressure 97/56.  Pulse is 64.  Temperature is 97.5.  HEENT:  Very thin and wasted.  EOMI.  PERLA.  Sclerae anicteric.  NECK:  Neck is supple without nodes.  CARDIOVASCULAR:  Regular rate and rhythm with S1 and S2.  PULMONARY:  Clear to A&P.  ABDOMEN:  Abdomen is scaphoid.  Bowel sounds are present, somewhat hyperactive.  She is tender in the mid-abdomen.  No palpable masses or hepatosplenomegaly.  RECTAL:  Exam is not done.  EXTREMITIES:  Edema 2+ bilaterally to the shins.  IMPRESSION: 1. 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 2. Left hip fracture, October 2001, secondary to fall in the hospital. 3. Status post total abdominal hysterectomy/bilateral salpingo-oophorectomy    secondary to remote cervical cancer. 4. Gastroesophageal reflux disease.  PLAN:  Patient is admitted by Dr. Ulyess Mort to the hospital service for IV fluid hydration, baseline labs, TSH, etc.  Will check chest x-ray, KUB and plan to start Panda tube feedings at a very low rate in addition to oral feedings.  Will obtain nutrition consult and psychiatric consult.  Consider trial of Megace.  Long-term, we will need to decide if she needs placement and whether she will be able to tolerate enteral feedings, i.e., PEG, or whether she will need Port-A-Cath for long-term TNA.  I do not think that she would be able to manage this at home and do not think this will likely be the best long-term solution.  For details, please see the orders. DD:  01/05/01 TD:  01/06/01 Job: [id number]

## 2011-02-05 NOTE — Discharge Summary (Signed)
[facility]  Patient:    [name], [name]                      MRN: [id number] Adm. Date:  [id number] Disc. Date: [id number] Attending:  Faith Rogue T Dictator:   Mcarthur Rossetti. Angiulli, P.A.                           Discharge Summary  DISCHARGE DIAGNOSES: 1. Left intertrochanteric hip fracture. 2. Postoperative anemia. 3. Radiation enteritis. 4. Pneumonia, resolved. 5. Depression. 6. History of cervical cancer with radiation therapy 20 years ago.  HISTORY OF PRESENT ILLNESS:  A 77 year old white female admitted to [facility] with a history of anxiety, cervical cancer, and subsequent radiation therapy.  Now with progressive weight loss of 50 pounds over a two-year duration.  Developed abdominal pain.  She was seen in the past by Sabino Gasser, M.D.  A gastric emptying scan on March 20, 2000, was normal.  A CT scan of the abdomen and pelvis was consistent with partial small bowel obstruction.  An MRI of the abdomen showed the celiac and SM8 to be patent. The upper endoscopy and sigmoidoscopy in 1996 showed gastritis and mild changes of radiation colitis.  Her weight is now 77 pounds.  On evaluation by the gastroenterology services of [facility], she was placed on TPN for nutrition.  She underwent EGD per Wilhemina Bonito. Eda Keys., M.D., finding a negative colonoscopy with biopsy on June 30, 2000, showing a normal colon and terminal ileum.  She continued to improve.  She gained 20 pounds.  TNP was ongoing.  Noted on July 09, 2000, with a fall in her room, sustaining a left intertrochanteric femur fracture.  Underwent open reduction and internal fixation on July 11, 2000, by Trudee Grip, M.D.  Placed on Coumadin for deep venous thrombosis prophylaxis.  A follow-up KUB showed resolving small bowel obstruction.  Placed on Ceftin on July 21, 2000, for a low-grade fever.  Chest x-ray showed left lower lobe pneumonia, which was  resolving. Endurance slowly improved.  Latest INR of 1.8.  Hemoglobin 9.5.  Chemistries unremarkable.  PAST MEDICAL HISTORY:  See discharge diagnoses.  PRIMARY MEDICAL DOCTOR:  Colen Darling. Charmayne Sheer., M.D., of Urgent [facility] on [address]  ALLERGIES:  None.  MEDICATIONS PRIOR TO ADMISSION:  Remeron, Effexor, Flagyl, and Levsin.  SOCIAL HISTORY:  Widowed.  Lives alone.  Independent prior to admission.  A sister with assistance that she provides intermittently.  HOSPITAL COURSE:  The patient made slow and progressive gains while on subacute services with therapies initiated daily.  The following issues were followed during the patients rehabilitation course:  Pertaining to [name] left intertrochanteric hip fracture, remained stable.  Surgical site healing nicely.  No signs of infection.  She was weightbearing as tolerated.  She completed her Coumadin for deep venous thrombosis prophylaxis. Postoperative anemia was stable.  Ongoing issues of limited appetite. Followed by gastroenterology services with essentially work-up being negative, except for showing some radiation enteritis.  She continued on her Protonix. She was on TNA feeds and calorie counts.  She was not felt to be a candidate for gastrostomy feeding tubes.  After discussions with gastroenterology services, it was felt that she could be discharged to home and observed on appetite and nutritional status.  She had a history of depression.  She was seen in follow-up by Adelene Amas. [place], M.D., and advised  to continue Remeron and Effexor.  Advised to follow up with Charlies Silvers, M.D., her psychiatrist.  She had no bowel or bladder disturbances.  She was ambulating short functional distances with a walker.  Essentially independent to standby assist in all areas of activities of daily living and dressing, grooming, and homemaking.  Her strength and endurance did improve and she was discharged to home.  The latest labs  showed a hemoglobin of 9.3, hematocrit 30.2, sodium 137, potassium 4.4, BUN 21, and creatinine 0.8.  Liver function studies were within normal limits.  DISCHARGE MEDICATIONS: 1. Remeron 45 mg at bedtime. 2. Protonix 40 mg daily. 3. Effexor XR 37.5 mg daily. 4. Zofran as needed for nausea.  DIET:  Advised to eat five small meals a day.  Diet was as tolerated.  ACTIVITY:  She was weightbearing as tolerated to the lower extremities.  SPECIAL INSTRUCTIONS:  Advised no alcohol, no smoking, and no driving.  FOLLOW-UP:  Follow up with Trudee Grip, M.D.  Call for appointment.  Follow up with Wilhemina Bonito. Eda Keys., M.D., of gastroenterology services in two weeks. Follow up with Charlies Silvers, M.D., of psychiatric services.  Call for appointment. DD:  08/15/00 TD:  08/15/00 Job: [id number]

## 2011-02-05 NOTE — H&P (Signed)
[place]  Patient:    [name], [name]                      MRN: [id number] Adm. Date:  [id number] Disc. Date: [id number] Attending:  Meredith Leeds                         History and Physical  CHIEF COMPLAINT: The patient is a 77 year old white female, who was admitted on this date for rehabilitation and convalescence following laparotomy with small bowel resection for a bezoar and stricture.  HISTORY OF PRESENT ILLNESS: That took place at [facility].  Her postoperative course was slow because of deconditioning due to long-term weight loss and vomiting.  However, she developed no wound infection or other surgical complications.  Her incision was nicely healed.  The patient has a history of depression.  She has no other GI problems that are identified.  See previous records for more details.  MEDICATIONS (prior to her illness):  1. Evista.  2. Lorazepam.  3. Phenergan.  4. Lasix.  5. Klor-Con.  6. Pepcid.  7. Remeron.  PAST MEDICAL HISTORY:  1. The patient has a history of carcinoma of the cervix which had been     treated by radiation and there was concern that she possibly had radiation     stricture; however, this was not necessarily borne out by findings.  2. The patient had a hip fracture in October 2001, and had recovered     relatively well from that.  3. She is status post total abdominal hysterectomy with bilateral     salpingo-oophorectomy.  4. There is some history of gastroesophageal reflux but this does not bother     her very much.  PHYSICAL EXAMINATION:  GENERAL: She is a thin elderly female, who seemed weak but in no acute distress.  VITAL SIGNS: Unremarkable as recorded by the nursing staff.  HEENT: Head, neck, eyes, ears, nose, mouth, and throat unremarkable.  CHEST: Clear to auscultation.  HEART: Rate and rhythm normal.  No murmur or gallop.  BREAST: Normal.  ABDOMEN: Healing lower midline  incision.  No evidence of infection or hernia. No mass.  RECTAL: Normal.  PELVIC: Vaginal not performed.  EXTREMITIES: No edema or deformity.  Good pulses.  SKIN: No lesions noted.  LYMPH NODES: None enlarged.  NEUROLOGIC: Grossly normal.  IMPRESSION:  1. Malnutrition and deconditioning due to long illness.  2. History of depression.  3. Recently status post abdominal surgery.  4. Chronic malnutrition, improved.  5. Gastroesophageal reflux disease.  PLAN: Admission for convalescence as noted above. DD:  02/20/01 TD:  02/20/01 Job: [id number]

## 2011-02-05 NOTE — Op Note (Signed)
[facility]  Patient:    [name], [name]                      MRN: [id number] Proc. Date: 07/11/00 Adm. Date:  [id number] Attending:  Charmaine Downs                           Operative Report  PREOPERATIVE DIAGNOSIS:  Left intertrochanteric femur fracture.  POSTOPERATIVE DIAGNOSIS:  Left intertrochanteric femur fracture.  PROCEDURE:  Open reduction and internal fixation of left intertrochanteric femur fracture.  SURGEON:  Ollen Gross, M.D.  ASSISTANT:  Alexzandrew L. Perkins, P.A.-C.  ANESTHESIA:  General.  ESTIMATED BLOOD LOSS:  200.  DRAINS:  Hemovac x 1.  COMPLICATIONS:  None.  CONDITION:  Stable to the recovery room.  CLINICAL NOTE:  The patient is a 77 year old female patient of Dr. Corinda Gubler on the GI service who had a fall Saturday evening sustaining a left intertrochanteric femur fracture.  She presents now for operative fixation.  DESCRIPTION OF PROCEDURE:  After successful administration of general anesthetic, the patient was placed on the operating table and her left lower extremity placed in traction.  Under fluoroscopic guidance, the fracture was reduced anatomically in the AP and lateral planes.  The hip was then prepped and draped in the usual sterile fashion.  Using a guide pin to mark the center of the femoral head and neck under fluoroscopic guidance, the incision line was then planned and drawn.  The incision was then made with the 10 blade. The skin was cut through the subcutaneous tissue to the level of the fascia lata and incised in line with the skin incision.  The fascia over the vastus lateralis was opened and incised.  Muscle fibers were split and the lateral cortex of the femur identified.  Under fluoroscopic guidance, a guide pin was then driven so as to lie slightly inferiorly in the AP plane, and straight center in the lateral plane.  The length is marked and 90 mm was the most appropriate.  Triple  reamers were then passed over this and reamed to 90 mm. A 135 degree four-hole side plate was utilized.  The 90 mm lag screw was then placed and the side plate was then ________ over the lag screw.  This was clamped to the femur with a Lowman clamp.  The distal two holes were then filled with cortical screws.  The clamp was removed and the proximal drill holes were also filled with cortical screws of appropriate length.  At this point, the images were taken in the AP and lateral plane showing excellent reduction of the fracture with good placement of the hardware.  The wound was then copiously irrigated with antibiotic solution, and the vastus lateralis fascia closed with a running #1 Vicryl.  The fascia lata was closed with interrupted #1 Vicryl over one limb of the Hemovac drain.  The subcutaneous was closed with interrupted 2-0 Vicryl, and subcuticular with 4-0 Monocryl.  The incision was clean and dry and Steri-Strips applied. Drains were hooked to suction and a bulky sterile dressing applied.  The patient awakened and transported to the recovery room in stable condition. DD:  07/11/00 TD:  07/12/00 Job: [id number]

## 2011-02-05 NOTE — Assessment & Plan Note (Signed)
[facility]                           GASTROENTEROLOGY OFFICE NOTE   NAME:[name], [name]                        MRN:          [id number]  DATE:07/04/2006                            DOB:          1925/01/06    A very nice lady who comes in on July 04, 2006, complaining of heartburn,  pain in her throat and feels like food gets stuck in her esophagus. She has  had several upper endoscopic examinations and these really had not revealed  any pathology of significance. Her last one was in 2001, by Dr. Marina Goodell.   Her only medications are now tramadol, promethazine, lorazepam and Legatrin  PM. She says her symptoms have worsened. She does not have any reflux.   Her past medical history reveals that she has had arthritis, anxiety,  depression, allergies, bladder problems, a hernia and she is status post  hysterectomy.   SOCIAL HISTORY:  Is really non-contributory.   REVIEW OF SYSTEMS:  Only reveals some problems with urination, muscle pain  and some leakage of urine and some fatigue and night sweats.   PHYSICAL EXAMINATION:  On physical examination, she is 5'2.5. Weight: 136.  Blood pressure: 180/90. Pulse: 60 and regular.  NECK:  Unremarkable.  HEART:  Unremarkable.  EXTREMITIES:  Unremarkable.  RECTAL: Is deferred.   IMPRESSION:  1. GERD.  2. History of osteoarthritis.  3. Anxiety/depression.  4. Status post hysterectomy.   RECOMMENDATION:  Recommendation is to get a barium swallow with the pill.  Put her on Prevacid SoluTab b.i.d. and will see how she does. If there is  any evidence that she has an obstructing lesion that is causing her symptoms  I think then we would consider doing a repeat  upper endoscopic examination with dilatation.  In the meantime, I will leave  her on the medication and see how she does with this.            ______________________________  Ulyess Mort, MD   SML/MedQ DD:  07/04/2006 DT:  07/05/2006 Job #:   [id number]

## 2011-02-05 NOTE — Discharge Summary (Signed)
[place]  Patient:    [name], [name]                     MRN: [id number] Disc. Date: 01/30/01 Attending:  Zigmund Daniel, M.D.                           Discharge Summary  HISTORY OF PRESENT ILLNESS:  The patient is a 77 year old white female admitted for convalescence to the [facility] following abdominal surgery associated with chronic malnutrition and weakness.  See the history and physical for details.  HOSPITAL COURSE:  The patient was given nutrition support, encouragement, physical therapy, occupational therapy, and general rehab efforts. She improved rapidly.  After about a week in the hospital, she felt strong enough to go home and take care of herself and there were no complications with the wound or otherwise.  She was discharged and is to see me in the office for follow-up.  DIAGNOSES: 1. Deconditioning following surgery and long illness. 2. Chronic malnutrition. 3. Depression. 4. Gastroesophageal reflux disease.  DISCHARGE CONDITION:  Improved. DD:  02/20/01 TD:  02/20/01 Job: [id number]

## 2011-02-05 NOTE — Procedures (Signed)
[facility]  Patient:    [name], [name]                      MRN: [id number] Proc. Date: 06/30/00 Adm. Date:  [id number] Attending:  Charmaine Downs CC:         Ulyess Mort, M.D. [facility]   Procedure Report  PROCEDURE:  Colonoscopy with biopsies.  SURGEON:  Wilhemina Bonito. Eda Keys., M.D.  INDICATIONS:  Abdominal pain, weight loss, and colorectal neoplasia screening.  HISTORY:  This is a 77 year old female who has had intermittent postprandial abdominal discomfort and profound weight loss.  She was admitted to the hospital with evidence of malnutrition.  She has had prior endoscopy, as well as radiographic studies demonstrating some abnormalities of the small intestine.  She is now for colonoscopy.  The nature of the procedure as well as the risks, benefits, and alternatives have been reviewed.  She understood and agreed to proceed.  It should be noted that the patient has a remote history of cervical cancer for which she underwent radiation therapy.  PHYSICAL EXAMINATION:  Frail cachectic female in no acute distress.  She is alert and oriented.  Vital signs are stable.  Lungs are clear.  Heart is regular.  Abdomen is soft.  DESCRIPTION OF PROCEDURE:  After informed consent was obtained, the patient was sedated with 60 mg of Demerol and 5 mg of Versed IV.  The Olympus pediatric colonoscope was then passed under direct vision per rectum and advanced through the entire length of the colon to the cecal tip.  The preparation was good.  The terminal ileum was intubated for a distance of 10-15 cm.  This appeared grossly normal.  Multiple biopsies of the ileum were taken.  Examination of the colonic mucosa from the cecal tip direction revealed no abnormalities.  IMPRESSION:  Normal colon and terminal ileum.  No explanation for symptoms. Status post multiple terminal ileal biopsies.  RECOMMENDATIONS: 1. Followup biopsies. 2. Proceed to upper  endoscopy with small bowel biopsies. 3. Small bowel follow through in a.m. DD:  06/30/00 TD:  07/02/00 Job: [id number]

## 2011-02-05 NOTE — Op Note (Signed)
[facility]  Patient:    [name], [name]                      MRN: [id number] Proc. Date: 01/16/01 Adm. Date:  [id number] Attending:  Charmaine Downs CC:         Sabino Gasser, M.D.  Ulyess Mort, M.D. [facility]   Operative Report  PREOPERATIVE DIAGNOSIS:  Chronic small bowel obstruction.  POSTOPERATIVE DIAGNOSIS:  Two strictures of the distal small intestine with obstruction by bezoar.  OPERATION PERFORMED:  Stricturoplasty, small bowel resection, extraction of bezoar.  SURGEON:  Dr. Orson Slick.  ASSISTANT:  Dr. Derrell Lolling.  ANESTHESIA:  General.  DESCRIPTION OF PROCEDURE:  After adequate monitoring and general anesthesia and routine preparation and draping of the abdomen with a Foley catheter in place, I made a lower abdominal incision at the site of a previous old incision. I carefully entered the peritoneal cavity and found that there were no adhesions of the small bowel to the anterior abdominal wall. A few omental adhesions were present and I took those down. I then checked the liver and found evidence of masses which were known to be present and felt that they were benign. I then ran the small bowel and found that it was quite dilated proximally and more decompressed distally. As I approached the distal ileum I found a very hard area in the small bowel and determined that it was a gallstone or a bezoar. I again looked at the upper abdomen and saw that there no adherence of the duodenum to the gallbladder or common bile duct to suggest that there might be a biliary enteric fistula. No inflammation was present in that area. I determined that this was probably a bezoar.  As I tried to squeeze it through into the colon it came against a narrowed area of the small intestine and would go no further. I opened the small bowel at that location after clamping it with spring clamps and found a very benign appearing mucosa present. I removed the  bezoar and sent it to pathology. I then closed the longitudinal hold transversely so as to widen the stricture. I used two 3-0 silks for the suture. Hemostasis was not a problem. Proximal to that, there was another area of quite thickened bowel which I felt was also strictured. Just proximal and distal to that, I stapled the bowel together side to side with the GIA stapler. I then segmentally divided the intervening mesentery between clamps and ligated the vessels with 2-0 silks. I closed the end of the bowel with another firing of the cutting stapler after determining that hemostasis was good in the staple lines. This provided an adequate area of anastomosis without any narrowing of the lumen. I closed the mesenteric defect with the 3-0 silk stitches. I checked the colon and other portions of the abdomen and found no other problems. I positioned the nasogastric tube in the mid stomach and concluded the operation. Sponge, needle and instrument counts were correct. I closed the fascia with running #1 PDS and closed the skin with staples. The patient tolerated the operation very well. DD:  01/16/01 TD:  01/17/01 Job: [id number]

## 2011-02-05 NOTE — Discharge Summary (Signed)
[name]  Patient:    [name], [name]                      MRN: [id number] Adm. Date:  [id number] Disc. Date: 07/22/00 Attending:  Justine Null CC:         Ulyess Mort, M.D. [place] [name]  Wilhemina Bonito. Eda Keys., M.D. [facility]  Sonda Primes, M.D. [facility]   Discharge Summary  TRANSFER SUMMARY  DISCHARGE DIAGNOSES:  1. Radiation enteritis.  2. Malnutrition.  3. [name]-acquired pneumonia.  4. Left hip fracture while hospitalized.  5. Anxiety/depression.  6. Mild hyperglycemia.  7. History of remote cervical cancer.  8. Gastroesophageal reflux disease.  9. Irritable bowel syndrome. 10. Total abdominal hysterectomy/bilateral salpingo-oophorectomy. 11. Status post radiation therapy approximately 20 years ago with progressive     weight loss over the past two years. 12. Anemia.  HISTORY OF PRESENT ILLNESS:  Please see that dictated per Dr. Terrial Rhodes, October 2001.  [name] COURSE:  [name] is a 77 year old white female admitted October 8 with profound weight loss, malnutrition, nausea, postprandial abdominal pains, and initial CT findings of the patient suggestive of partial small bowel obstruction versus radiation enteritis with stricture and marked hypoalbuminemia for initial treatment with TPN, PICC line, and further treatment.  Initial sedimentation rate for TSH within normal limits.  CT of the abdomen and pelvis revealed 2.8 cm hepatic hemangioma, 1.2 cm multiple cysts of the liver, slightly distended small bowel loop, no mass or lymphadenopathy.  Pancreatic duct 4 mm.  During her initial hospitalization, her antidepressant therapy of Effexor was augmented with Remeron.  She was later discontinued on the Effexor.  She was noted to be at the time mildly hypokalemic and with normocytic anemia, hemoglobin approximately 9.2 on admission.  Small bowel follow-through mostly consistent with radiation enteritis, no discrete focal transition  point such as stricture or mass.  On October 12, she was given a soft diet, continued on the TNA which she tolerated fairly well and diet was advanced although she continued to have relatively poor p.o. intake throughout her hospitalization.  Flagyl was begun on October 16.  Continuous TNA was changed to cyclic TNA.  Unfortunately, her hospitalization was complicated by a fall October 20 whereupon she suffered a left intertrochanteric femur fracture.  She was transfused two units of packed red blood cells prior to surgery with a hemoglobin of 10.9.  She underwent surgical repair on October 22.  She was thereafter anticoagulated postoperatively with Coumadin, INR on transfer 1.8.  Her postoperative course was complicated by development of [name]-acquired treated initially with ceftazidime.  Besides symptoms, she had left retrocardiac infiltrate which resolved on subsequent x-rays.  After approximately one week of ceftazidime, she was changed to Ceftin and, although continued to have low-grade temperatures, she continued to respond clinically with resolution of pulmonary symptoms to minimal cough, O2 saturation 96% on room air, no chest pain, wheezing, or other symptoms.  She was able to gradually ambulate with physical therapy.  PMNR consultation was obtained October 23 and she was felt to be an adequate candidate for the SACU program at [place] [name] when medically stable.  Although she did have low-grade temperatures throughout the rest of her hospitalization, abdominal discomfort had resolved.  At the time of transfer, she had a low-grade temperature of 100.2, no chills, blood pressure 120/64, respirations 24, O2 saturation 96%, CBGs in the low 90s on a daily basis.  Prior to transfer, INR 1.8.  October 31, blood cultures - no growth to date.   She was clinically doing well.  Besides her low-grade temperature, she was felt to have gained maximum benefit from this hospitalization  and is to be transferred to the SACU at [name].  DISPOSITION:  Transferred to [facility] today.  TRANSFER MEDICATIONS:  1. Remeron 45 mg p.o. q.h.s.  2. Reglan 5 mg t.i.d. with meals.  3. Ceftin 250 mg p.o. b.i.d. for two days, then discontinue.  4. Coumadin 5 mg p.o. q.d.  5. Ensure Plus HN chocolate central TNA, cyclic.  6. Demerol 25 mg IV q.4h. p.r.n. which she is not using.  7. Ativan 1 mg t.i.d. p.r.n.  8. Ambien 5 mg p.o. q.h.s. p.r.n.  9. Dulcolax 10 mg q.d. p.r.n. 10. Mylanta. 11. Fleet enema. 12. Darvocet one to two tablets q.4h. p.r.n.  DIET:  Regular.  ACTIVITY:  As tolerated.  To continue PT, OT per SACU.  FOLLOW-UP:  She will need to follow up with orthopedics, Dr. Lequita Halt, after [facility] stay. DD:  07/22/00 TD:  07/22/00 Job: [id number]

## 2011-02-05 NOTE — Discharge Summary (Signed)
[facility]  Patient:    [name], [name]                     MRN: [id number] Adm. Date:  01/05/01 Disc. Date: 01/24/01 Attending:  Zigmund Daniel, M.D. CC:         Ulyess Mort, M.D. [facility]  Corwin Levins, M.D. [facility]   Discharge Summary  HISTORY:  The patient is a 77 year old white female, who has been losing weight, having intermittent abdominal pain, nausea and vomiting for at least a year.  She had been advised to undergo exploration about a year ago but declined.  She was admitted to the hospital for what appeared to be bowel obstruction last fall and was found to have jejunal dilatation.  The patient was readmitted on April 15 with symptoms consistent with intestinal obstruction.  She has lost weight until she weighed about 85 pounds.  The patient has been on a number of medicines for the GI tract.  She is on lorazepam for her nerves.  She has history of depression.  She had a hip fracture in September 2001.  The patient has had cervical cancer in the past and had a hysterectomy and radiation therapy for that.  See the history and physical for further details.  PHYSICAL EXAMINATION:  GENERAL:  The patient is frail.  ABDOMEN:  Distended and slightly tender.  It is soft, however.  See history and physical for further details.  HOSPITAL COURSE:  The patient was admitted and given supportive IV fluids.  An attempt at tube feeding was made.  Abdominal x-rays suggested intestinal obstruction.  I saw the patient in consultation and recommended intravenous nutrition and attempt at allowing the bowel obstruction to resolve, as it was felt to be possibly due to radiation enteritis.  The patient did receive total nutrient admixture via a PICC line.  Her vomiting improved, but she continued to have abdominal distension and some tenderness and nausea and did not eat well.  I recommended surgery again, and the patient accepted.  On January 16, 2001, I  explored her and found two strictures of the small bowel with a bezoar in the small bowel, producing near complete obstruction of the distal jejunum. I did a small bowel resection, a stricturoplasty, and extraction of the bezoar.  She gradually improved following that.  There were no infectious comp due to the surgery.  The patient has gotten somewhat stronger but remains weak and does not eat very well.  For this reason, we feel it is wise for her to go to the subacute care unit for further care and rehabilitation.  She is to go today.  Staples remain in place.  These will be removed in a few days.  The wound shows no sign of infection.  The bowels are moving well, and the patient feels well.  She became quite anemic postoperatively with a hemoglobin of 7.4 and received two units of packed cells, raising her hemoglobin to 10.1.  I will follow the patient at the subacute care unit.  DIAGNOSES: 1. Small intestinal obstruction due to small bowel stricture and bezoar. 2. Malnutrition. 3. Deconditioning. 4. Anemia. 5. Depression.  OPERATION:  Small bowel resection with stricturoplasty and extraction of bezoar.  DISCHARGE CONDITION:  Improved. DD:  01/24/01 TD:  01/24/01 Job: [id number]

## 2011-02-05 NOTE — H&P (Signed)
[facility]  Patient:    [name], [name]                        MRN: [id number] Adm. Date:  06/27/00 Attending:  Ulyess Mort, M.D. [facility] Dictator:   Amy Esterwood, P.A.C.                         History and Physical  CHIEF COMPLAINT:  Weight loss, weakness and abdominal pain.  HISTORY:  The patient is a very nice 77 year old white female with history of GERD, IBS and anxiety.  She does have a remote history of cervical cancer approximately 20 years ago and underwent total abdominal hysterectomy, BSO and subsequent radiation therapy.  She apparently did well for many years after that but has had progressive difficulty over the past two years with weight loss, now totalling over 50 pounds.  She has developed postprandial abdominal pain as well.  She says her pain is not constant and does not occur with every meal; however, sometimes, she has discomfort immediately after eating and other times, she may have pain which starts about an hour after a meal and then lasts for two to three hours thereafter.  She has also had "attacks" of abdominal pain which have lasted a couple of the days and have been associated with nausea and vomiting.  Patient says she has no appetite but has been trying to drink Boost or Ensure, one can per day, if she can get it down.  It sounds as if she is eating very little p.o.  She has not had any fever or chills, does occasionally have some loose stools, but not daily, and no melena or hematochezia.  Patient has been evaluated by Dr. Sabino Gasser earlier this summer for a second opinion and had been tried on a variety of medications without much success.  She had a gastric emptying scan in July of 2001 at [facility] which was normal, CT scan of the abdomen and pelvis in July of 2001 consistent with a partial small-bowel obstruction with question of a transition zone in the distal ileal region, MRI of the abdomen and MRA of  the abdomen, also done in July, also showed the celiac and SMA to be widely patent with a normal portal vein and one hepatic hemangioma and multiple renal cysts. In our office, her last upper endoscopy and flexible sigmoidoscopy were done in 1996.  She had gastritis and some mild changes of radiation colitis.  I am not certain whether or not she has had more recent colonoscopy with Dr. Virginia Rochester, but I believe she has not.  This time, patient comes in to our office complaining of progressive decline and malnutrition, has not developed peripheral edema over the past couple of months as well, her weight is down to 77 pounds -- she was 127 pounds a couple of years ago -- and is admitted to the hospital at this time for failure to thrive/malnutrition, for further diagnostic evaluation and nutritional support.  CURRENT MEDICATIONS  1. Phenergan 25 mg p.r.n.  2. Darvocet-N 100 one every six hours as needed.  3. Gas-X p.r.n.  4. Remeron 30 mg q.h.s.  5. Axid AR one to two q.d.  6. Eldertonic one tablespoon p.r.n.  7. Lorazepam 1 mg two to three q.d.  8. Levsin sublingual p.r.n.  9. Centrum q.d. 10. Keflex 500 mg q.i.d. p.r.n. for UTI. 11. Imodium p.r.n. 12. Effexor XR  150 mg q.d., which she says she skips occasionally.  ALLERGIES:  No known drug allergies.  PAST HISTORY:  As outlined above, also pertinent for depression.  FAMILY HISTORY:  Negative for GI disease.  SOCIAL HISTORY:  The patient is widowed and lives alone.  She does have two sisters nearby who are very supportive.  No tobacco and no ETOH.  REVIEW OF SYSTEMS:  CARDIOVASCULAR:  Denies any chest pain or anginal symptoms.  PULMONARY:  Denies any cough, shortness of breath or sputum production.  GENITOURINARY:  Pertinent for intermittent urinary tract infections.  GI:  As above.  MUSCULOSKELETAL AND SKIN:  Patient does have peripheral edema over the past couple of months and also has had some scattered skin breakdown.  PHYSICAL  EXAMINATION  GENERAL:  Well-developed, cachectic white female in no acute distress, alert and oriented x 3.  VITAL SIGNS:  Blood pressure 90/60, pulse in the 80s.  Weight is 77 pounds.  HEENT:  Pertinent for evidence of muscular wasting.  Patient is anicteric.  NECK:  There is no JVD or bruit or cervical nodes.  CARDIOVASCULAR:  Regular rate and rhythm with S1 and S2.  PULMONARY:  Clear to A&P.  ABDOMEN:  Soft, flat.  Bowel sounds are active.  There is no focal abdominal tenderness.  No mass or hepatosplenomegaly and no distention.  RECTAL:  Exam not done at this time.  EXTREMITIES:  Edema 2+ to the knees and scattered skin tears and breakdown. No petechiae and no upper extremity edema.  NEUROLOGIC:  Grossly nonfocal.  LABORATORY DATA:  Labs drawn, September 2001, show an albumin of 2.3, WBC of 4.7, hemoglobin 11.8, hematocrit of 36, MCV of 94, platelets 299,000.  LFTs are within normal limits.  Amylase and lipase normal.  CEA 1.7.  IMPRESSION  1. [age]-year-old white female with profound weight loss/malnutrition,     with nausea and postprandial abdominal pain, with CT findings suggestive     of partial small-bowel obstruction; rule out changes secondary to     radiation enteritis with stricture; rule out adhesions versus other     lesion.  2. Depression.  3. Hypoalbuminemia with peripheral edema felt secondary to hypoalbuminemia.  4. History of remote cervical cancer, status post total abdominal     hysterectomy, bilateral salpingo-oophorectomy and radiation therapy.  PLAN:  Patient is admitted to the service of Dr. Wilhemina Bonito. Eda Keys., M.D., who is covering the hospital, seen by Dr. Ulyess Mort in the office.  She will be hydrated.  Will plan to place a PICC line for TPN, check baseline laboratory studies, stool for fat, repeat abdominal films and CT scan of the abdomen and pelvis, she may need small-bowel follow-through as well, and for further plans,  please see the orders. DD:  06/27/00 TD:  06/28/00 Job: [id number]

## 2011-02-05 NOTE — H&P (Signed)
[facility]  Patient:    [name], [name]                     MRN: [id number] Adm. Date:  01/02/01 Attending:  Ulyess Mort, M.D. [facility] Dictator:   Mike Gip, P.A.-C. CC:         Corwin Levins, M.D. [facility]  Charlies Silvers, M.D.   History and Physical  CHIEF COMPLAINT:  Malnutrition.  HISTORY OF PRESENT ILLNESS:  [name] is a 77 year old white female with history of radiation enteritis and malnutrition with anxiety, depression, remote history of cervical cancer with radiation 20+ years ago, also with history of reflux and IBS. She is status post remote TAH and BSO. She had an admission June 27, 2000, through July 22, 2000, with malnutrition and weight at that time of 75 pounds. Apparently, her normal weight 2 to 3 years ago was approximately 140 pounds. She had extensive workup during that admission with small bowel follow through showing mid to distal jejunum with moderate dilation and a transition to more normal distal ileum consistent with radiation enteritis. There was no focal stricture noted. CT of the abdomen and pelvis showed resolving partial small-bowel obstruction, a slightly prominent common bile duct, multiple probable liver and renal cysts. BE showed a probable radiation changes in the sigmoid area. Upper endoscopy per Wilhemina Bonito. Eda Keys., M.D. was normal. Colonoscopy also normal during that admission. Biopsies of both the duodenum and terminal ileum were unremarkable. She was tube fed and fed with TNA during that stay. Her course was complicated by hospital-acquired pneumonia and a left hip fracture secondary to a fall. She eventually was transferred to rehab and had TNA for a total of 3 to 4 weeks. She did gain approximately 10 pounds. She was seen in our office by GI only once since that time per Dr. Corinda Gubler and was weighing 85 pounds. She then no-showed for two appointments and is now referred by Corwin Levins, M.D.  She says she is followed regularly by Charlies Silvers, M.D. for psychiatry and is on Remeron and lorazepam. Her weight at this time is back to 75 pounds fully dressed. She says she has no energy. She lives alone. Says she eats soup and crackers primarily. Does not generally eat any breakfast and generally will eat soup for dinner, as well. I am unable to get any specific history out of her and, therefore, question the accuracy of her intake. She says that she is only able to eat small amounts and that she really does not have any appetite. She has not been using any supplements which were advised as she says she does not like the taste, and they just will not go down. She says she is occasionally having vomiting though not on any sort of regular basis. No dysphagia or odynophagia. She does have some intermittent abdominal discomfort and bloating and loose stools at times without melena or hematochezia. She also complains of numbness in her feet and toes, swelling in her legs, staggering episodes at home, and poor memory. She admits to depression but is a very vague historian and will not elaborate on any specific symptoms. Her sister accompanies her currently and says that they do check on her but not on a daily basis, and they do occasionally take her out to eat, etc. Again, this does not sound like it is happening on any daily basis. The patient is admitted at this time from the office for  malnutrition and dehydration.  CURRENT MEDICATIONS: 1. ______ one p.o. b.i.d. 2. Evista 60 q.d. 3. Lorazepam 0.5 b.i.d. 4. Phenergan b.i.d. p.r.n. 5. Lasix 40 q.d. 6. Klor-Con 10 q.d. 7. Pepcid AC p.r.n. 8. Remeron 45 q.h.s.  ALLERGIES:  No known drug allergies.  PAST MEDICAL HISTORY:  As outlined above.  FAMILY HISTORY:  Negative for colon cancer, polyps, or other GI diseases.  SOCIAL HISTORY:  The patient is widowed, lives alone. Does have sisters who are somewhat supportive. No ETOH  and no tobacco.  REVIEW OF SYSTEMS:  CARDIOVASCULAR:  Denies any chest pain or anginal symptoms. PULMONARY:  Negative for cough, shortness of breath, or sputum production. GENITOURINARY:  Negative for dysuria, urgency, or frequency.  PHYSICAL EXAMINATION:  GENERAL:  Well-developed, frail, very cachectic-appearing, white female in no acute distress.  VITAL SIGNS:  Weight is 75, blood pressure 97/56, pulse is 64, temperature is 97.5.  HEENT:  Atraumatic, normocephalic. EOMI. PERRLA. She is very thin and wasted.  NECK:  No JVD or nodes.  CARDIOVASCULAR:  Regular rate and rhythm with S1 and S2.  PULMONARY:  Clear to A&P.  ABDOMEN:  Scaphoid. Bowel sounds are active, somewhat hyperactive. She is tender in her mid abdomen. There is no palpable mass or hepatosplenomegaly.  RECTAL:  Not done at this time.  EXTREMITIES:  Revealed 2+ edema bilaterally to the shins.  IMPRESSION: 33. A 77 year old white female with chronic malnutrition, likely multifactorial    secondary to radiation enteritis and depression. 2. Left hip fracture, October 2001. 3. Status post total abdominal hysterectomy, bilateral salpingo-oophorectomy    secondary to remote cervical cancer. 4. Gastroesophageal reflux disease.  PLAN:  The patient is admitted to the service of Judie Petit T. Pleas Koch., M.D. for IV fluid hydration, baseline labs. We will check a TSH, etc. Chest x-ray, plain abdominal films, start IV fluids. We will plan to start Panda tube feedings at a very low rate at 20 cc per hour initially and see if she can tolerate this. We will also ask for a nutrition consult. Long-term, we will need to decide if she needs placement in a nursing facility and whether she will be able to tolerate enteral feedings, i.e. PEG or whether she will require a Port-A-Cath for long-term TNA which I do not think she will be able to manage well at home living by herself. Will also ask psychiatry to see her  and give  consideration to a trial of Megace. DD:  01/02/01 TD:  01/02/01 Job: [id number]

## 2011-02-19 ENCOUNTER — Encounter: Payer: Medicare Other | Attending: Physical Medicine & Rehabilitation

## 2011-02-19 ENCOUNTER — Ambulatory Visit: Payer: Medicare Other | Admitting: Physical Medicine & Rehabilitation

## 2011-02-19 DIAGNOSIS — M79609 Pain in unspecified limb: Secondary | ICD-10-CM | POA: Insufficient documentation

## 2011-02-19 DIAGNOSIS — R209 Unspecified disturbances of skin sensation: Secondary | ICD-10-CM | POA: Insufficient documentation

## 2011-02-19 DIAGNOSIS — M810 Age-related osteoporosis without current pathological fracture: Secondary | ICD-10-CM | POA: Insufficient documentation

## 2011-02-19 DIAGNOSIS — F341 Dysthymic disorder: Secondary | ICD-10-CM | POA: Insufficient documentation

## 2011-02-19 DIAGNOSIS — M48061 Spinal stenosis, lumbar region without neurogenic claudication: Secondary | ICD-10-CM | POA: Insufficient documentation

## 2011-02-19 DIAGNOSIS — IMO0001 Reserved for inherently not codable concepts without codable children: Secondary | ICD-10-CM | POA: Insufficient documentation

## 2011-02-19 DIAGNOSIS — M19079 Primary osteoarthritis, unspecified ankle and foot: Secondary | ICD-10-CM | POA: Insufficient documentation

## 2011-02-19 DIAGNOSIS — M549 Dorsalgia, unspecified: Secondary | ICD-10-CM | POA: Insufficient documentation

## 2011-02-19 NOTE — Assessment & Plan Note (Signed)
HISTORY:  An 77 year old female who has had complaints of lower extremity burning pain for greater than one years time and she also has back pain.  She has undergone neurologic evaluation and has had a EMG which was reported as normal.  She has been on medications for fibromyalgia syndrome.  Trying Lyrica in the past, has been on narcotic analgesics in the past as well.  OTHER PAST MEDICAL HISTORY:  Significant for DJD of the ankle, osteoporosis, depression, and anxiety.  Her EMG was unremarkable at [facility].  She had MRI of the lumbar spine which really showed no compressive lesions affecting the lower lumbar area.  She does have some scoliosis as well as spondylotic changes at L2-3.  She has had vascular ultrasound which was negative.  Pain level is described as 8-9/10 involving her neck, back, leg, arms, feet, hands.  She can climb steps.  She can drive.  She is totally independent with all of her self-care and mobility.  No assisted device.  REVIEW OF SYSTEMS:  Positive for bladder control problems, numbness, tingling, trouble walking, dizziness, depression and anxiety, pain free urination, coughing, swelling, and diarrhea.  SOCIAL HISTORY:  Widow.  Lives alone.  No alcohol.  No drug use.  No smoking.  MEDICATIONS:  Pristiq, prescribed by Dr. Andee Poles, from Psychiatry.  She has had some Darvocet in the past, still has a few that she is taken here and there.  She is taking multiple supplements both homeopathic as well as nutritional supplements including potassium gluconate.  She is on Cymbalta.  She is on gabapentin 300 t.i.d.  PHYSICAL EXAMINATION:  VITAL SIGNS:  Blood pressure 180/63, she was instructed follow up with her primary care physician on this.  Pulse 53, respirations 18, and sat 96% on room air. MUSCULOSKELETAL:  She has 18 fibromyalgia tender points positive. Lower extremities have good pulses bilaterally.  She has normal sensation to pinprick as  well as light touch bilaterally in lower extremities.  She has no skin breakdown.  No dysvascular changes.  No evidence of muscle atrophy.  IMPRESSION: 1. Fibromyalgia syndrome I think most her pain could complaints are     related to this. 2. Lumbar stenosis really no clear-cut radicular symptomatology and     diffuse pain is both above and below the waist.  RECOMMENDATIONS:  I do not think she would benefit from narcotic analgesic medications.  I discussed with the patient I think the best treatment approach would be some physical therapy for gentle stretching and moderate aerobic activity.  We will check CMET given that she is on multiple medications.  She is kind of feeling tired or out of sorts.  I also instructed her to follow with her primary care physician in terms of her blood pressure.  As this also may play into some of her more generalized symptomatology.  I will see her back in 1-2 months and we will review her lab work.     Erick Colace, M.D. Electronically Signed    AEK/MedQ D:  02/19/2011 13:44:35  T:  02/19/2011 22:02:06  Job #:  [id number]  cc:   Corwin Levins, MD [address] [place] [address]

## 2011-03-09 ENCOUNTER — Ambulatory Visit: Payer: Medicare Other | Admitting: Physical Medicine & Rehabilitation

## 2011-03-29 ENCOUNTER — Ambulatory Visit: Payer: Medicare Other | Admitting: Physical Medicine & Rehabilitation

## 2011-03-29 ENCOUNTER — Encounter: Payer: Medicare Other | Attending: Physical Medicine & Rehabilitation

## 2011-03-29 DIAGNOSIS — M47817 Spondylosis without myelopathy or radiculopathy, lumbosacral region: Secondary | ICD-10-CM

## 2011-03-29 DIAGNOSIS — F341 Dysthymic disorder: Secondary | ICD-10-CM | POA: Insufficient documentation

## 2011-03-29 DIAGNOSIS — IMO0001 Reserved for inherently not codable concepts without codable children: Secondary | ICD-10-CM | POA: Insufficient documentation

## 2011-03-29 DIAGNOSIS — R209 Unspecified disturbances of skin sensation: Secondary | ICD-10-CM

## 2011-03-29 DIAGNOSIS — M79609 Pain in unspecified limb: Secondary | ICD-10-CM | POA: Insufficient documentation

## 2011-03-29 DIAGNOSIS — M25519 Pain in unspecified shoulder: Secondary | ICD-10-CM | POA: Insufficient documentation

## 2011-03-29 DIAGNOSIS — M48061 Spinal stenosis, lumbar region without neurogenic claudication: Secondary | ICD-10-CM | POA: Insufficient documentation

## 2011-03-30 ENCOUNTER — Ambulatory Visit: Payer: Medicare Other | Admitting: Physical Therapy

## 2011-03-30 NOTE — Assessment & Plan Note (Signed)
REASON FOR VISIT:  Pain in feet and pain in back.  An 77 year old female with long history of foot pain has been seen by Neurology in the past.  She had EMG showing no evidence of neuropathy. She has had workup for neuropathy reportedly negative.  MRI of the lumbar spine showed no compressive lesions.  It did show scoliosis and spondylotic changes L2-3.  She has had vascular ultrasound which were normal.  Previous visits, the patient was complaining more of all over body pain. She does mention some complaints of left upper extremity discomfort. She has chronic bladder control problems, sees Urology.  She has chronic depression, anxiety, sees Psychiatry for this.  Her primary care physician, Dr. Darryll Capers or Oliver Barre.  I did order BMET and CMET which is normal.  Glucose was a bit low at that time.  Medications include Pristiq, Cymbalta, lorazepam and gabapentin and Urogesic.  She has finally been scheduled physical therapy for tomorrow.  She complains of numbness, tingling in the feet, trouble walking, dizziness, depression and anxiety on review of systems.  No recent falls.  Blood pressure 155/79, pulse 66, respirations 18 and O2 sat 95% on room air.  Elderly female in no acute distress.  Posture is somewhat is kyphotic.  She has normal strength in the upper and lower extremities. She has normal pulses in the lower extremities.  Her sensation is normal in lower extremities.  She asks whether she has gout, but she has no joint swelling in the first MTPs or in the lower extremities.  Her deep tendon reflexes are normal at the knees and mildly diminished at the ankles.  Her lumbar spine range of motion is limited about 25% forward flexion, extension, lateral rotation and bending.  She has some tenderness bilateral upper traps and low back in her fibromyalgia tender points.  IMPRESSION:  Chronic pain syndrome, multifactorial.  She does have fibromyalgia which I think is her  primary pain complaint.  In addition, she does have lumbar scoliosis, no significant stenosis, but positive spondylosis.  She has a complicated situation where she takes multiple medications that could potentially interact with narcotic analgesics and placed her at a greater fall risk.  I would like to start out by putting her through some physical therapy to increase lower extremity strength, increase balance as well as improve lumbar spine mobility.  If she does not benefit from this, she maybe a candidate for medial branch blocks.  I will have her see my mid- level next month after she goes through some physical therapy and if not getting much benefit trial medial branch blocks and failing this consider a very low-dose hydrocodone.  Certainly, this would be a last resort given her age and multiple medications.  She is totally independent at this point without an assisted device.  We will increase her gabapentin because of her foot complaints.  This maybe fibromyalgia related.  No obvious etiology otherwise.     Erick Colace, M.D. Electronically Signed   AEK/MedQ D:  03/29/2011 16:19:45  T:  03/30/2011 00:31:59  Job #:  [id number]

## 2011-04-06 ENCOUNTER — Ambulatory Visit: Payer: Medicare Other | Admitting: Physical Therapy

## 2011-05-03 ENCOUNTER — Encounter: Payer: Medicare Other | Admitting: Neurosurgery

## 2011-05-11 ENCOUNTER — Encounter: Payer: Self-pay | Admitting: Internal Medicine

## 2011-05-11 ENCOUNTER — Other Ambulatory Visit: Payer: Self-pay | Admitting: Internal Medicine

## 2011-05-11 ENCOUNTER — Other Ambulatory Visit (INDEPENDENT_AMBULATORY_CARE_PROVIDER_SITE_OTHER): Payer: Medicare Other

## 2011-05-11 ENCOUNTER — Ambulatory Visit (INDEPENDENT_AMBULATORY_CARE_PROVIDER_SITE_OTHER): Payer: Medicare Other | Admitting: Internal Medicine

## 2011-05-11 VITALS — BP 122/82 | HR 73 | Temp 98.6°F | Ht 62.0 in | Wt 135.8 lb

## 2011-05-11 DIAGNOSIS — J019 Acute sinusitis, unspecified: Secondary | ICD-10-CM

## 2011-05-11 DIAGNOSIS — R7309 Other abnormal glucose: Secondary | ICD-10-CM

## 2011-05-11 DIAGNOSIS — R7302 Impaired glucose tolerance (oral): Secondary | ICD-10-CM | POA: Insufficient documentation

## 2011-05-11 DIAGNOSIS — R5381 Other malaise: Secondary | ICD-10-CM

## 2011-05-11 DIAGNOSIS — M797 Fibromyalgia: Secondary | ICD-10-CM | POA: Insufficient documentation

## 2011-05-11 DIAGNOSIS — G8929 Other chronic pain: Secondary | ICD-10-CM | POA: Insufficient documentation

## 2011-05-11 DIAGNOSIS — R5383 Other fatigue: Secondary | ICD-10-CM

## 2011-05-11 DIAGNOSIS — R42 Dizziness and giddiness: Secondary | ICD-10-CM | POA: Insufficient documentation

## 2011-05-11 DIAGNOSIS — Z0000 Encounter for general adult medical examination without abnormal findings: Secondary | ICD-10-CM | POA: Insufficient documentation

## 2011-05-11 LAB — CBC WITH DIFFERENTIAL/PLATELET
Basophils Absolute: 0.1 10*3/uL (ref 0.0–0.1)
Eosinophils Absolute: 0.1 10*3/uL (ref 0.0–0.7)
Lymphocytes Relative: 8.6 % — ABNORMAL LOW (ref 12.0–46.0)
MCHC: 32.5 g/dL (ref 30.0–36.0)
MCV: 92.7 fl (ref 78.0–100.0)
Monocytes Absolute: 0.6 10*3/uL (ref 0.1–1.0)
Neutrophils Relative %: 79.9 % — ABNORMAL HIGH (ref 43.0–77.0)
RDW: 14.2 % (ref 11.5–14.6)

## 2011-05-11 LAB — URINALYSIS, ROUTINE W REFLEX MICROSCOPIC
Bilirubin Urine: NEGATIVE
Hgb urine dipstick: NEGATIVE
Total Protein, Urine: NEGATIVE
Urine Glucose: NEGATIVE
Urobilinogen, UA: 0.2 (ref 0.0–1.0)

## 2011-05-11 LAB — BASIC METABOLIC PANEL
CO2: 29 mEq/L (ref 19–32)
Chloride: 108 mEq/L (ref 96–112)
Potassium: 4.3 mEq/L (ref 3.5–5.1)
Sodium: 143 mEq/L (ref 135–145)

## 2011-05-11 LAB — HEPATIC FUNCTION PANEL
ALT: 18 U/L (ref 0–35)
AST: 21 U/L (ref 0–37)
Alkaline Phosphatase: 54 U/L (ref 39–117)
Bilirubin, Direct: 0 mg/dL (ref 0.0–0.3)
Total Bilirubin: 0.5 mg/dL (ref 0.3–1.2)
Total Protein: 6.4 g/dL (ref 6.0–8.3)

## 2011-05-11 LAB — LIPID PANEL
Cholesterol: 206 mg/dL — ABNORMAL HIGH (ref 0–200)
HDL: 50.7 mg/dL (ref >39.00)
Total CHOL/HDL Ratio: 4
VLDL: 18.4 mg/dL (ref 0.0–40.0)

## 2011-05-11 LAB — HEMOGLOBIN A1C: Hgb A1c MFr Bld: 5.7 % (ref 4.6–6.5)

## 2011-05-11 MED ORDER — CEPHALEXIN 500 MG PO CAPS: 500.0000 mg | ORAL_CAPSULE | Freq: Four times a day (QID) | ORAL | Status: AC

## 2011-05-11 MED ORDER — MECLIZINE HCL 12.5 MG PO TABS: 12.5000 mg | ORAL_TABLET | Freq: Three times a day (TID) | ORAL | Status: AC | PRN

## 2011-05-11 NOTE — Assessment & Plan Note (Signed)
Mild to mod, for antibx course,  to f/u any worsening symptoms or concerns 

## 2011-05-11 NOTE — Assessment & Plan Note (Signed)
Etiology unclear, Exam otherwise benign, to check labs as documented, follow with expectant management  

## 2011-05-11 NOTE — Assessment & Plan Note (Signed)
This time likely due to left ear involvement with above, for antivert and mucinex prn

## 2011-05-11 NOTE — Progress Notes (Signed)
  Subjective:    Patient ID: [name], female    DOB: October 09, 1924, 77 y.o.   MRN: [id number]  HPI  Here with 2 wks acute onset fever, facial pain, pressure, general weakness and malaise, and greenish d/c, with slight ST, but little to no cough and Pt denies chest pain, increased sob or doe, wheezing, orthopnea, PND, increased LE swelling, palpitations,  or syncope, but has had left ear fullness and vertigo with nausea, but no hearing loss or vomiting.  Pt denies chest pain, increased sob or doe, wheezing, orthopnea, PND, increased LE swelling, palpitations, dizziness or syncope.  Pt denies new neurological symptoms such as new headache, or facial or extremity weakness or numbness   Pt denies polydipsia, polyuria.  Has seen neuro with dx of FMS, c/o diffuse pain, acute myalgias with current illness on chronic, just miserable today.   Has been referred to pain management, been seen 5 times "and they haven't done anything"  Denies worsening depressive symptoms, suicidal ideation, or panic, though has ongoing anxiety.  Does have sense of ongoing fatigue, but denies signficant hypersomnolence. Past Medical History  Diagnosis Date  . ALLERGIC RHINITIS 10/12/2007  . ANEMIA-NOS 10/12/2007  . ANXIETY 06/14/2007  . CERVICAL CANCER, HX OF 06/14/2007  . DEGENERATIVE DISC DISEASE, LUMBOSACRAL SPINE W/RADICULOPATHY 02/03/2010  . DEGENERATIVE JOINT DISEASE, ANKLE 01/01/2010  . DEPRESSION 06/14/2007  . FATIGUE 10/12/2007  . GASTROENTERITIS/COLITIS D/T RADIATION 06/14/2007  . GERD 06/14/2007  . GLUCOSE INTOLERANCE 10/12/2007  . IBS 06/14/2007  . MALNUTRITION 06/14/2007  . MENOPAUSAL DISORDER 05/28/2009  . OSTEOPOROSIS 10/12/2007  . PARESTHESIA 01/01/2010  . RASH-NONVESICULAR 11/14/2007  . SINUSITIS- ACUTE-NOS 10/12/2007   Past Surgical History  Procedure Date  . Abdominal hysterectomy   . (l) hip fracture   . S/p sb resection and stricturoplasty 09/2000  . Appendectomy   . Oophorectomy     reports that she has never  smoked. She does not have any smokeless tobacco history on file. She reports that she does not drink alcohol or use illicit drugs. family history includes Lung cancer in her daughter. No Known Allergies No current outpatient prescriptions on file prior to visit.   Review of Systems Review of Systems  Constitutional: Negative for diaphoresis and unexpected weight change.  HENT: Negative for drooling and tinnitus.   Eyes: Negative for photophobia and visual disturbance.  Respiratory: Negative for choking and stridor.   Gastrointestinal: Negative for vomiting and blood in stool.  Genitourinary: Negative for hematuria and decreased urine volume.       Objective:   Physical Exam BP 122/82  Pulse 73  Temp(Src) 98.6 F (37 C) (Oral)  Ht 5\' 2"  (1.575 m)  Wt 135 lb 12.8 oz (61.598 kg)  BMI 24.84 kg/m2  SpO2 97% Physical Exam  VS noted Constitutional: Pt appears well-developed and well-nourished.  HENT: Head: Normocephalic.  Right Ear: External ear normal.  Left Ear: External ear normal.  Bilat tm's mild erythema.  Sinus tender bilat.  Pharynx mild erythema Eyes: Conjunctivae and EOM are normal. Pupils are equal, round, and reactive to light.  Neck: Normal range of motion. Neck supple.  Cardiovascular: Normal rate and regular rhythm.   Pulmonary/Chest: Effort normal and breath sounds normal.  Neurological: Pt is alert. No cranial nerve deficit. motor/dtr/gait intact, FTN intact Skin: Skin is warm. No erythema.  Psychiatric: Pt behavior is normal. Thought content normal. + depressed affect, 1+ nervous        Assessment & Plan:

## 2011-05-11 NOTE — Assessment & Plan Note (Signed)
asympt - for a1c check,  to f/u any worsening symptoms or concerns

## 2011-05-11 NOTE — Patient Instructions (Signed)
Take all new medications as prescribed Continue all other medications as before Please go to LAB in the Basement for the blood and/or urine tests to be done today Please call the phone number [phone] (the PhoneTree System) for results of testing in 2-3 days;  When calling, simply dial the number, and when prompted enter the MRN number above (the Medical Record Number) and the # key, then the message should start.  

## 2011-05-12 LAB — LDL CHOLESTEROL, DIRECT: Direct LDL: 149.2 mg/dL

## 2011-05-13 ENCOUNTER — Other Ambulatory Visit: Payer: Self-pay

## 2011-05-13 MED ORDER — PRAVASTATIN SODIUM 40 MG PO TABS: 40.0000 mg | ORAL_TABLET | Freq: Every evening | ORAL | Status: DC

## 2011-06-10 ENCOUNTER — Encounter: Payer: Self-pay | Admitting: Internal Medicine

## 2011-06-10 ENCOUNTER — Ambulatory Visit (INDEPENDENT_AMBULATORY_CARE_PROVIDER_SITE_OTHER): Payer: Medicare Other | Admitting: Internal Medicine

## 2011-06-10 ENCOUNTER — Other Ambulatory Visit (INDEPENDENT_AMBULATORY_CARE_PROVIDER_SITE_OTHER): Payer: Medicare Other

## 2011-06-10 VITALS — BP 122/80 | HR 63 | Temp 98.6°F | Ht 62.0 in | Wt 140.5 lb

## 2011-06-10 DIAGNOSIS — R7309 Other abnormal glucose: Secondary | ICD-10-CM

## 2011-06-10 DIAGNOSIS — R531 Weakness: Secondary | ICD-10-CM

## 2011-06-10 DIAGNOSIS — R410 Disorientation, unspecified: Secondary | ICD-10-CM

## 2011-06-10 DIAGNOSIS — R7302 Impaired glucose tolerance (oral): Secondary | ICD-10-CM

## 2011-06-10 DIAGNOSIS — F29 Unspecified psychosis not due to a substance or known physiological condition: Secondary | ICD-10-CM

## 2011-06-10 DIAGNOSIS — R5381 Other malaise: Secondary | ICD-10-CM

## 2011-06-10 DIAGNOSIS — R5383 Other fatigue: Secondary | ICD-10-CM

## 2011-06-10 DIAGNOSIS — M5126 Other intervertebral disc displacement, lumbar region: Secondary | ICD-10-CM

## 2011-06-10 LAB — HEPATIC FUNCTION PANEL
Albumin: 3.7 g/dL (ref 3.5–5.2)
Alkaline Phosphatase: 53 U/L (ref 39–117)
Total Protein: 6.8 g/dL (ref 6.0–8.3)

## 2011-06-10 LAB — URINALYSIS, ROUTINE W REFLEX MICROSCOPIC
Hgb urine dipstick: NEGATIVE
Nitrite: NEGATIVE
Total Protein, Urine: NEGATIVE
Urobilinogen, UA: 0.2 (ref 0.0–1.0)

## 2011-06-10 LAB — CBC WITH DIFFERENTIAL/PLATELET
Basophils Absolute: 0 10*3/uL (ref 0.0–0.1)
Hemoglobin: 13.9 g/dL (ref 12.0–15.0)
Lymphocytes Relative: 9.3 % — ABNORMAL LOW (ref 12.0–46.0)
Monocytes Relative: 9.6 % (ref 3.0–12.0)
Neutro Abs: 6.1 10*3/uL (ref 1.4–7.7)
RBC: 4.61 Mil/uL (ref 3.87–5.11)
RDW: 13.9 % (ref 11.5–14.6)
WBC: 7.9 10*3/uL (ref 4.5–10.5)

## 2011-06-10 LAB — BASIC METABOLIC PANEL
BUN: 14 mg/dL (ref 6–23)
CO2: 26 mEq/L (ref 19–32)
Calcium: 8.8 mg/dL (ref 8.4–10.5)
Creatinine, Ser: 1.1 mg/dL (ref 0.4–1.2)
Glucose, Bld: 101 mg/dL — ABNORMAL HIGH (ref 70–99)

## 2011-06-10 MED ORDER — NITROFURANTOIN MACROCRYSTAL 100 MG PO CAPS: 100.0000 mg | ORAL_CAPSULE | Freq: Every day | ORAL | Status: DC

## 2011-06-10 MED ORDER — DULOXETINE HCL 60 MG PO CPEP: 60.0000 mg | ORAL_CAPSULE | Freq: Every day | ORAL | Status: DC

## 2011-06-10 MED ORDER — CEPHALEXIN 500 MG PO CAPS: 500.0000 mg | ORAL_CAPSULE | Freq: Four times a day (QID) | ORAL | Status: AC

## 2011-06-10 MED ORDER — AMITRIPTYLINE HCL 100 MG PO TABS: 100.0000 mg | ORAL_TABLET | Freq: Every day | ORAL | Status: DC

## 2011-06-10 MED ORDER — DULOXETINE HCL 30 MG PO CPEP: 30.0000 mg | ORAL_CAPSULE | Freq: Every day | ORAL | Status: DC

## 2011-06-10 NOTE — Progress Notes (Signed)
Subjective:    Patient ID: [name], female    DOB: 1925-02-09, 77 y.o.   MRN: [id number]  HPI  Here to f/u after starting pravachol 4 wks ago - admits to not taking "very often" and in fact has gotten away from taking several of her meds, here alone, ? With some memory loss in evidence today, I suspect hx somewhat unreliable, no family with her, clearly confused and weak more than usual.  States she is taking gabapentin but seems confused about this as well.  Has 2 daughters in [place], but only sees one on Sunday.  Pt continues to have recurring right LBP without change in severity, bowel or bladder change, fever, wt loss,  worsening LE pain/numbness/weakness, gait change or falls, also lyrica too expensive.  Sleeps a lot during the day.  Chronic depression persists;  "I wish I could find a doctor that could just fix me."  Non suicidal.  Denies hyper or hypo thyroid symptoms such as voice, skin or hair change.  Lytica too expensive though helped her LBP, asks for cymbalta as she was helped with this as well in past. Past Medical History  Diagnosis Date  . ALLERGIC RHINITIS 10/12/2007  . ANEMIA-NOS 10/12/2007  . ANXIETY 06/14/2007  . CERVICAL CANCER, HX OF 06/14/2007  . DEGENERATIVE DISC DISEASE, LUMBOSACRAL SPINE W/RADICULOPATHY 02/03/2010  . DEGENERATIVE JOINT DISEASE, ANKLE 01/01/2010  . DEPRESSION 06/14/2007  . FATIGUE 10/12/2007  . GASTROENTERITIS/COLITIS D/T RADIATION 06/14/2007  . GERD 06/14/2007  . GLUCOSE INTOLERANCE 10/12/2007  . IBS 06/14/2007  . MALNUTRITION 06/14/2007  . MENOPAUSAL DISORDER 05/28/2009  . OSTEOPOROSIS 10/12/2007  . PARESTHESIA 01/01/2010  . RASH-NONVESICULAR 11/14/2007  . SINUSITIS- ACUTE-NOS 10/12/2007   Past Surgical History  Procedure Date  . Abdominal hysterectomy   . (l) hip fracture   . S/p sb resection and stricturoplasty 09/2000  . Appendectomy   . Oophorectomy     reports that she has never smoked. She does not have any smokeless tobacco history on file. She  reports that she does not drink alcohol or use illicit drugs. family history includes Lung cancer in her daughter. No Known Allergies Current Outpatient Prescriptions on File Prior to Visit  Medication Sig Dispense Refill  . LORazepam (ATIVAN) 0.5 MG tablet Take 0.5 mg by mouth 3 (three) times daily as needed.        . pravastatin (PRAVACHOL) 40 MG tablet Take 1 tablet (40 mg total) by mouth every evening.  30 tablet  11  . amitriptyline (ELAVIL) 100 MG tablet Take 100 mg by mouth at bedtime.        . celecoxib (CELEBREX) 200 MG capsule Take 200 mg by mouth 2 (two) times daily.        . cetirizine (ZYRTEC) 10 MG tablet Take 10 mg by mouth daily.        . citalopram (CELEXA) 20 MG tablet Take 20 mg by mouth daily.        . clotrimazole-betamethasone (LOTRISONE) cream Apply topically 2 (two) times daily as needed.        Marland Kitchen denosumab (PROLIA) 60 MG/ML SOLN Inject 60 mg into the skin every 6 (six) months.        . nitrofurantoin (MACRODANTIN) 100 MG capsule Take 100 mg by mouth at bedtime.        Marland Kitchen omeprazole (PRILOSEC) 20 MG capsule Take 40 mg by mouth daily.        . pregabalin (LYRICA) 50 MG capsule Take 50 mg by mouth  2 (two) times daily.         Review of Systems Review of Systems  Constitutional: Negative for diaphoresis and unexpected weight change.  HENT: Negative for drooling and tinnitus.   Eyes: Negative for photophobia and visual disturbance.  Respiratory: Negative for choking and stridor.   Gastrointestinal: Negative for vomiting and blood in stool.  Genitourinary: Negative for hematuria and decreased urine volume.    Objective:   Physical Exam BP 122/80  Pulse 63  Temp(Src) 98.6 F (37 C) (Oral)  Ht 5\' 2"  (1.575 m)  Wt 140 lb 8 oz (63.73 kg)  BMI 25.70 kg/m2  SpO2 96% Physical Exam  VS noted Constitutional: Pt appears well-developed and well-nourished.  HENT: Head: Normocephalic.  Right Ear: External ear normal.  Left Ear: External ear normal.  Eyes: Conjunctivae and  EOM are normal. Pupils are equal, round, and reactive to light.  Neck: Normal range of motion. Neck supple.  Cardiovascular: Normal rate and regular rhythm.   Pulmonary/Chest: Effort normal and breath sounds normal.  Abd:  Soft, NT, non-distended, + BS, no flank tender Neurological: Pt is alert. No cranial nerve deficit.  Skin: Skin is warm. No erythema.  Psychiatric: Pt behavior is normal. Thought content somewhat confused today, short term memory some dysfunctional today    Assessment & Plan:

## 2011-06-10 NOTE — Assessment & Plan Note (Addendum)
With chronic pain and depression, lyrica too expensive, for cymbalta asd as she has had some improvement with this in the past

## 2011-06-10 NOTE — Patient Instructions (Addendum)
OK to stop the pravastatin completely (the new one for cholesterol) Please go to LAB in the Basement for the blood and/or urine tests to be done today Please call the phone number [phone] (the PhoneTree System) for results of testing in 2-3 days;  When calling, simply dial the number, and when prompted enter the MRN number above (the Medical Record Number) and the # key, then the message should start. Take all new medications as prescribed -  Please start the cymbalta by taking 30 mg per day for one week, then you can increase the dose to total of 60 mg per day after that (such as 2 of the 30 mg pills, then later on use the 60 mg pill prescription) Continue all other medications as before, except ok to stop the celebrex, zyrtec, lotrisone, prilosec celexa and lyrica as well Please return in 3 weeks with Lab testing done 3-5 days before

## 2011-06-10 NOTE — Assessment & Plan Note (Signed)
Mild worsening since last visit, no falls but signficant - ? Due to statin, unclear as it appears she has not been taking only intermittent meds recently;  To stop the pravachol for now, re-check labs o/w exam benign today

## 2011-06-10 NOTE — Assessment & Plan Note (Signed)
No evidence for fall or trauma, but displays some increased confusion over usual, I suspect not taking the macrodantin lately, will check urine studies

## 2011-06-12 ENCOUNTER — Encounter: Payer: Self-pay | Admitting: Internal Medicine

## 2011-06-12 NOTE — Assessment & Plan Note (Addendum)
asympt - also for a1c today, Continue all other medications as before

## 2011-07-01 ENCOUNTER — Ambulatory Visit: Payer: Medicare Other | Admitting: Internal Medicine

## 2011-07-06 ENCOUNTER — Other Ambulatory Visit: Payer: Self-pay | Admitting: Internal Medicine

## 2011-07-06 ENCOUNTER — Ambulatory Visit (INDEPENDENT_AMBULATORY_CARE_PROVIDER_SITE_OTHER): Payer: Medicare Other | Admitting: Internal Medicine

## 2011-07-06 ENCOUNTER — Encounter: Payer: Self-pay | Admitting: Internal Medicine

## 2011-07-06 VITALS — BP 118/82 | HR 105 | Temp 98.2°F | Ht 62.0 in | Wt 137.0 lb

## 2011-07-06 DIAGNOSIS — IMO0001 Reserved for inherently not codable concepts without codable children: Secondary | ICD-10-CM

## 2011-07-06 DIAGNOSIS — M797 Fibromyalgia: Secondary | ICD-10-CM

## 2011-07-06 DIAGNOSIS — R7302 Impaired glucose tolerance (oral): Secondary | ICD-10-CM

## 2011-07-06 DIAGNOSIS — R7309 Other abnormal glucose: Secondary | ICD-10-CM

## 2011-07-06 DIAGNOSIS — F329 Major depressive disorder, single episode, unspecified: Secondary | ICD-10-CM

## 2011-07-06 DIAGNOSIS — R5381 Other malaise: Secondary | ICD-10-CM

## 2011-07-06 DIAGNOSIS — N390 Urinary tract infection, site not specified: Secondary | ICD-10-CM | POA: Insufficient documentation

## 2011-07-06 DIAGNOSIS — G8929 Other chronic pain: Secondary | ICD-10-CM

## 2011-07-06 MED ORDER — ACETAMINOPHEN-CODEINE 300-30 MG PO TABS: 1.0000 | ORAL_TABLET | Freq: Four times a day (QID) | ORAL | Status: DC | PRN

## 2011-07-06 NOTE — Patient Instructions (Addendum)
OK to hold off on any further testing at this time (no urine or blood tests needed today) Continue all other medications as before Please see the podiatrist as you have planned for the feet pain

## 2011-07-06 NOTE — Assessment & Plan Note (Signed)
Chronic persistent, to cont the cymbalta, overall o/w stable, verified  Nonsuicidal, declines counseling or psychiatry eval

## 2011-07-06 NOTE — Assessment & Plan Note (Signed)
Improved, labs reveiwed with pt, no further evalaution needed

## 2011-07-06 NOTE — Progress Notes (Signed)
Addended by: Corwin Levins on: 07/06/2011 03:29 PM   Modules accepted: Orders

## 2011-07-06 NOTE — Assessment & Plan Note (Signed)
C/o ongoing pain on current meds, mild overall, I encouraged pt to try cont same tx

## 2011-07-06 NOTE — Progress Notes (Signed)
Subjective:    Patient ID: [name], female    DOB: [birth year], 77 y.o.   MRN: [id number]  HPI  Here to f/u;  Pt denies chest pain, increased sob or doe, wheezing, orthopnea, PND, increased LE swelling, palpitations, dizziness or syncope except for chronic dizziness she normally has.  Pt denies new neurological symptoms such as new headache, or facial or extremity weakness or numbness   Pt denies polydipsia, polyuria, or low sugar symptoms such as weakness or confusion improved with po intake.  Pt states overall good compliance with meds, trying to follow lower cholesterol, diabetic diet, wt overall stable but little exercise however.   Does have ongoing feet pain , worse on the left and plans to see podiatry.  Much less fatigued and clear of slower cognitive ability as noted last visit.   Denies urinary symptoms such as dysuria, frequency, urgency,or hematuria.   Pt denies fever, wt loss, night sweats, loss of appetite, or other constitutional symptoms, but c/o ongoing MSK pain such as c/w her FMS, overall stable. Past Medical History  Diagnosis Date  . ALLERGIC RHINITIS 10/12/2007  . ANEMIA-NOS 10/12/2007  . ANXIETY 06/14/2007  . CERVICAL CANCER, HX OF 06/14/2007  . DEGENERATIVE DISC DISEASE, LUMBOSACRAL SPINE W/RADICULOPATHY 02/03/2010  . DEGENERATIVE JOINT DISEASE, ANKLE 01/01/2010  . DEPRESSION 06/14/2007  . FATIGUE 10/12/2007  . GASTROENTERITIS/COLITIS D/T RADIATION 06/14/2007  . GERD 06/14/2007  . GLUCOSE INTOLERANCE 10/12/2007  . IBS 06/14/2007  . MALNUTRITION 06/14/2007  . MENOPAUSAL DISORDER 05/28/2009  . OSTEOPOROSIS 10/12/2007  . PARESTHESIA 01/01/2010  . RASH-NONVESICULAR 11/14/2007  . SINUSITIS- ACUTE-NOS 10/12/2007   Past Surgical History  Procedure Date  . Abdominal hysterectomy   . (l) hip fracture   . S/p sb resection and stricturoplasty 09/2000  . Appendectomy   . Oophorectomy     reports that she has never smoked. She does not have any smokeless tobacco history on file. She  reports that she does not drink alcohol or use illicit drugs. family history includes Lung cancer in her daughter. No Known Allergies Current Outpatient Prescriptions on File Prior to Visit  Medication Sig Dispense Refill  . amitriptyline (ELAVIL) 100 MG tablet Take 1 tablet (100 mg total) by mouth at bedtime.  90 tablet  3  . DULoxetine (CYMBALTA) 60 MG capsule Take 1 capsule (60 mg total) by mouth daily.  30 capsule  11  . LORazepam (ATIVAN) 0.5 MG tablet Take 0.5 mg by mouth 3 (three) times daily as needed.        . nitrofurantoin (MACRODANTIN) 100 MG capsule Take 1 capsule (100 mg total) by mouth at bedtime.  30 capsule  11   Review of Systems Review of Systems  Constitutional: Negative for diaphoresis and unexpected weight change.  HENT: Negative for drooling and tinnitus.   Eyes: Negative for photophobia and visual disturbance.  Respiratory: Negative for choking and stridor.   Gastrointestinal: Negative for vomiting and blood in stool.  Genitourinary: Negative for hematuria and decreased urine volume.     Objective:   Physical Exam BP 118/82  Pulse 105  Temp(Src) 98.2 F (36.8 C) (Oral)  Ht 5\' 2"  (1.575 m)  Wt 137 lb (62.143 kg)  BMI 25.06 kg/m2  SpO2 98% Physical Exam  VS noted, not ill appearing, not confused, slow but able to get up on exam table Constitutional: Pt appears well-developed and well-nourished.  HENT: Head: Normocephalic.  Right Ear: External ear normal.  Left Ear: External ear normal.  Eyes: Conjunctivae and EOM  are normal. Pupils are equal, round, and reactive to light.  Neck: Normal range of motion. Neck supple.  Cardiovascular: Normal rate and regular rhythm.   Pulmonary/Chest: Effort normal and breath sounds normal.  Abd:  Soft, NT, non-distended, + BS, no flank tender Neurological: Pt is alert. No cranial nerve deficit.  Skin: Skin is warm. No erythema.  Psychiatric: Pt behavior is normal. Thought content normal.     Assessment & Plan:

## 2011-07-06 NOTE — Assessment & Plan Note (Signed)
Recently tx low grade with now improved general weakness and much less confused today;  Ok for no furhter eval or tx at this time

## 2011-07-06 NOTE — Assessment & Plan Note (Signed)
Pt requests further for pain - ok for limited tylenol #3

## 2011-07-06 NOTE — Assessment & Plan Note (Signed)
stable overall by hx and exam, most recent data reviewed with pt, and pt to continue medical treatment as before ble  Lab Results  Component Value Date   HGBA1C 5.9 06/10/2011

## 2011-07-28 ENCOUNTER — Other Ambulatory Visit: Payer: Self-pay | Admitting: Internal Medicine

## 2011-08-11 ENCOUNTER — Encounter: Payer: Self-pay | Admitting: Internal Medicine

## 2011-08-11 ENCOUNTER — Other Ambulatory Visit (INDEPENDENT_AMBULATORY_CARE_PROVIDER_SITE_OTHER): Payer: Medicare Other

## 2011-08-11 ENCOUNTER — Ambulatory Visit (INDEPENDENT_AMBULATORY_CARE_PROVIDER_SITE_OTHER): Payer: Medicare Other | Admitting: Internal Medicine

## 2011-08-11 VITALS — BP 138/90 | HR 73 | Temp 97.9°F | Ht 61.0 in | Wt 139.0 lb

## 2011-08-11 DIAGNOSIS — R300 Dysuria: Secondary | ICD-10-CM

## 2011-08-11 DIAGNOSIS — M79672 Pain in left foot: Secondary | ICD-10-CM

## 2011-08-11 DIAGNOSIS — R252 Cramp and spasm: Secondary | ICD-10-CM

## 2011-08-11 DIAGNOSIS — M79609 Pain in unspecified limb: Secondary | ICD-10-CM

## 2011-08-11 LAB — URINALYSIS, ROUTINE W REFLEX MICROSCOPIC
Leukocytes, UA: NEGATIVE
Nitrite: NEGATIVE
Specific Gravity, Urine: 1.025 (ref 1.000–1.030)
pH: 5.5 (ref 5.0–8.0)

## 2011-08-11 MED ORDER — CYCLOBENZAPRINE HCL 5 MG PO TABS: ORAL_TABLET | ORAL | Status: DC

## 2011-08-11 MED ORDER — ACETAMINOPHEN-CODEINE #3 300-30 MG PO TABS: 1.0000 | ORAL_TABLET | Freq: Four times a day (QID) | ORAL | Status: DC | PRN

## 2011-08-11 MED ORDER — CEPHALEXIN 500 MG PO CAPS: 500.0000 mg | ORAL_CAPSULE | Freq: Four times a day (QID) | ORAL | Status: AC

## 2011-08-11 NOTE — Patient Instructions (Addendum)
Take all new medications as prescribed Continue all other medications as before You will be contacted regarding the referral for: podiatry Please go to LAB in the Basement for the  urine tests to be done today Please call the phone number [phone] (the PhoneTree System) for results of testing in 2-3 days;  When calling, simply dial the number, and when prompted enter the MRN number above (the Medical Record Number) and the # key, then the message should start.

## 2011-08-13 ENCOUNTER — Telehealth: Payer: Self-pay

## 2011-08-13 LAB — URINE CULTURE

## 2011-08-13 NOTE — Telephone Encounter (Signed)
Message copied by Sandi Mealy on Fri Aug 13, 2011  8:33 AM ------      Message from: Corwin Levins      Created: Wed Aug 11, 2011  5:14 PM       Left message on phone tree  - study c/w poss uti -                 1)  For cephalexin course - done per emr            Robin to inform pt,

## 2011-08-13 NOTE — Telephone Encounter (Signed)
Called patient to inform per MD//No answer, line busy x 3.

## 2011-08-15 ENCOUNTER — Encounter: Payer: Self-pay | Admitting: Internal Medicine

## 2011-08-15 NOTE — Progress Notes (Signed)
Subjective:    Patient ID: [name], female    DOB: [birth year], 77 y.o.   MRN: [id number]  HPI  Here to f/u;  Mentions she is still miserable with feeling poorly, chronic dizziness, generalized aches and pains but with pain to distal left foot at the toes where the second toe is cockup and seems to radiate pain to the midfoot, causes much difficulty walking, tends to favor the LLE, and also assoc apparently with increased left calf cramps at night as well, no claudication symptoms or related to excercise.  No falls.  Pt denies chest pain, increased sob or doe, wheezing, orthopnea, PND, increased LE swelling, palpitations, dizziness or syncope.  Pt denies new neurological symptoms such as new headache, or facial or extremity weakness or numbness   Pt denies polydipsia, polyuria.  Also incidentally with 3 days onset dysuria,  Denies  frequency, urgency,or hematuria, flank pain, n/v, fever, chills. Past Medical History  Diagnosis Date  . ALLERGIC RHINITIS 10/12/2007  . ANEMIA-NOS 10/12/2007  . ANXIETY 06/14/2007  . CERVICAL CANCER, HX OF 06/14/2007  . DEGENERATIVE DISC DISEASE, LUMBOSACRAL SPINE W/RADICULOPATHY 02/03/2010  . DEGENERATIVE JOINT DISEASE, ANKLE 01/01/2010  . DEPRESSION 06/14/2007  . FATIGUE 10/12/2007  . GASTROENTERITIS/COLITIS D/T RADIATION 06/14/2007  . GERD 06/14/2007  . GLUCOSE INTOLERANCE 10/12/2007  . IBS 06/14/2007  . MALNUTRITION 06/14/2007  . MENOPAUSAL DISORDER 05/28/2009  . OSTEOPOROSIS 10/12/2007  . PARESTHESIA 01/01/2010  . RASH-NONVESICULAR 11/14/2007  . SINUSITIS- ACUTE-NOS 10/12/2007   Past Surgical History  Procedure Date  . Abdominal hysterectomy   . (l) hip fracture   . S/p sb resection and stricturoplasty 09/2000  . Appendectomy   . Oophorectomy     reports that she has never smoked. She does not have any smokeless tobacco history on file. She reports that she does not drink alcohol or use illicit drugs. family history includes Lung cancer in her daughter. No Known  Allergies Current Outpatient Prescriptions on File Prior to Visit  Medication Sig Dispense Refill  . DULoxetine (CYMBALTA) 60 MG capsule Take 1 capsule (60 mg total) by mouth daily.  30 capsule  11  . LORazepam (ATIVAN) 0.5 MG tablet Take 0.5 mg by mouth 3 (three) times daily as needed.        Marland Kitchen omeprazole (PRILOSEC) 20 MG capsule TAKE 2 CAPSULES BY MOUTH EVERY DAY  180 capsule  2   Review of Systems Review of Systems  Constitutional: Negative for diaphoresis and unexpected weight change.  HENT: Negative for drooling and tinnitus.   Eyes: Negative for photophobia and visual disturbance.  Respiratory: Negative for choking and stridor.   Gastrointestinal: Negative for vomiting and blood in stool.  Genitourinary: Negative for hematuria and decreased urine volume.    Objective:   Physical Exam BP 138/90  Pulse 73  Temp(Src) 97.9 F (36.6 C) (Oral)  Ht 5\' 1"  (1.549 m)  Wt 139 lb (63.05 kg)  BMI 26.26 kg/m2  SpO2 93% Physical Exam  VS noted Constitutional: Pt appears well-developed and well-nourished.  HENT: Head: Normocephalic.  Right Ear: External ear normal.  Left Ear: External ear normal.  Eyes: Conjunctivae and EOM are normal. Pupils are equal, round, and reactive to light.  Neck: Normal range of motion. Neck supple.  Cardiovascular: Normal rate and regular rhythm.   Pulmonary/Chest: Effort normal and breath sounds normal.  Abd:  Soft, NT, non-distended, + BS Neurological: Pt is alert. No cranial nerve deficit.  Skin: Skin is warm. No erythema.  Psychiatric: Pt behavior is  normal. Thought content normal.  Left foot with 1+ dorsalis pedis, no erythema or swelling or tender,  Does have second toe cockup    Assessment & Plan:

## 2011-08-15 NOTE — Assessment & Plan Note (Signed)
neurovasc intact today, for flexeril prn,  to f/u any worsening symptoms or concerns

## 2011-08-15 NOTE — Assessment & Plan Note (Addendum)
?   Due to cockup toe vs other - no sign of gout, trauma, infection - ok for podiatry referral,  to f/u any worsening symptoms or concerns, has chronic pain, ok for tylenol #3 prn

## 2011-08-15 NOTE — Assessment & Plan Note (Signed)
?   UTI - for urine studies today, for antibx pending results

## 2011-08-16 NOTE — Telephone Encounter (Signed)
Called the patient informed of results and prescription sent to pharmacy.

## 2011-10-28 ENCOUNTER — Other Ambulatory Visit: Payer: Self-pay | Admitting: Internal Medicine

## 2011-11-08 ENCOUNTER — Encounter: Payer: Self-pay | Admitting: Internal Medicine

## 2011-11-08 ENCOUNTER — Ambulatory Visit (INDEPENDENT_AMBULATORY_CARE_PROVIDER_SITE_OTHER): Payer: Medicare Other | Admitting: Internal Medicine

## 2011-11-08 VITALS — BP 130/82 | HR 85 | Temp 98.2°F | Wt 138.8 lb

## 2011-11-08 DIAGNOSIS — R42 Dizziness and giddiness: Secondary | ICD-10-CM

## 2011-11-08 DIAGNOSIS — H8110 Benign paroxysmal vertigo, unspecified ear: Secondary | ICD-10-CM

## 2011-11-08 MED ORDER — MECLIZINE HCL 25 MG PO TABS: 25.0000 mg | ORAL_TABLET | Freq: Three times a day (TID) | ORAL | Status: DC | PRN

## 2011-11-08 NOTE — Progress Notes (Signed)
  Subjective:    Patient ID: [name], female    DOB: [birth year], 78 y.o.   MRN: [id number]  HPI Complains of dizziness sensation Symptoms worse with movement, improved with rest and cessation of activity No associated chest tightness, shortness of breath or palpitations No headache or vision changes, no falls or head trauma Improved with over-the-counter Dramamine, little change in symptoms with low-dose meclizine September 2012 No falls or weakness  Past Medical History  Diagnosis Date  . ALLERGIC RHINITIS   . ANEMIA-NOS   . ANXIETY   . CERVICAL CANCER, HX OF   . DEGENERATIVE DISC DISEASE, LUMBOSACRAL SPINE W/RADICULOPATHY   . DEGENERATIVE JOINT DISEASE, ANKLE   . DEPRESSION   . GASTROENTERITIS/COLITIS D/T RADIATION   . GERD   . GLUCOSE INTOLERANCE   . IBS   . MALNUTRITION   . MENOPAUSAL DISORDER   . OSTEOPOROSIS      Review of Systems  Constitutional: Positive for fatigue. Negative for fever, activity change and appetite change.  HENT: Negative for congestion and tinnitus.   Respiratory: Negative for cough and shortness of breath.   Cardiovascular: Negative for chest pain and palpitations.  Neurological: Positive for dizziness and light-headedness. Negative for headaches.       Objective:   Physical Exam BP 130/82  Pulse 85  Temp(Src) 98.2 F (36.8 C) (Oral)  Wt 138 lb 12.8 oz (62.959 kg)  SpO2 95% Wt Readings from Last 3 Encounters:  11/08/11 138 lb 12.8 oz (62.959 kg)  08/11/11 139 lb (63.05 kg)  07/06/11 137 lb (62.143 kg)   Constitutional: She is elderly but appears well-developed and well-nourished. No distress.  HENT: Head: Normocephalic and atraumatic. Ears: B TMs ok, no erythema or effusion; Nose: Nose normal.  Mouth/Throat: Oropharynx is clear and moist. No oropharyngeal exudate.  Eyes: Conjunctivae and EOM are normal. Pupils are equal, round, and reactive to light. No nystagmus, No scleral icterus.  Neck: Normal range of motion. Neck supple. No  JVD or LAD present. No thyromegaly present.  Cardiovascular: Normal rate, regular rhythm and normal heart sounds.  No murmur heard. No BLE edema. Pulmonary/Chest: Effort normal and breath sounds normal. No respiratory distress. She has no wheezes.  Neurological: She is alert and oriented to person, place, and time. No cranial nerve deficit. Coordination normal.   Lab Results  Component Value Date   WBC 7.9 06/10/2011   HGB 13.9 06/10/2011   HCT 42.6 06/10/2011   PLT 201.0 06/10/2011   GLUCOSE 101* 06/10/2011   CHOL 206* 05/11/2011   TRIG 92.0 05/11/2011   HDL 50.70 05/11/2011   LDLDIRECT 149.2 05/11/2011   LDLCALC 130* 05/28/2009   ALT 19 06/10/2011   AST 20 06/10/2011   NA 139 06/10/2011   K 4.5 06/10/2011   CL 108 06/10/2011   CREATININE 1.1 06/10/2011   BUN 14 06/10/2011   CO2 26 06/10/2011   TSH 0.72 06/10/2011   HGBA1C 5.9 06/10/2011       Assessment & Plan:   Dizziness, chronic - BPPV - improved with Dramamine but not low-dose meclizine - neuro exam and ENT exam benign - increase dose meclizine and followup in 2 weeks with PCP, call sooner if worse

## 2011-11-08 NOTE — Patient Instructions (Signed)
It was good to see you today. Try higher dose meclizine as discussed - 25 mg 3 times a day as needed - Your prescription(s) have been submitted to your pharmacy. Please take as directed and contact our office if you believe you are having problem(s) with the medication(s). Followup with Dr. Jonny Ruiz in one to 2 weeks to review dizziness symptoms and other problems, call sooner if problems

## 2011-11-11 ENCOUNTER — Ambulatory Visit: Payer: Medicare Other | Admitting: Internal Medicine

## 2011-11-24 ENCOUNTER — Ambulatory Visit (INDEPENDENT_AMBULATORY_CARE_PROVIDER_SITE_OTHER): Payer: Medicare Other | Admitting: Internal Medicine

## 2011-11-24 ENCOUNTER — Encounter: Payer: Self-pay | Admitting: Internal Medicine

## 2011-11-24 ENCOUNTER — Telehealth: Payer: Self-pay | Admitting: Internal Medicine

## 2011-11-24 ENCOUNTER — Other Ambulatory Visit (INDEPENDENT_AMBULATORY_CARE_PROVIDER_SITE_OTHER): Payer: Medicare Other

## 2011-11-24 VITALS — BP 142/80 | HR 69 | Temp 98.1°F | Ht 62.0 in | Wt 138.0 lb

## 2011-11-24 DIAGNOSIS — R5381 Other malaise: Secondary | ICD-10-CM

## 2011-11-24 DIAGNOSIS — F329 Major depressive disorder, single episode, unspecified: Secondary | ICD-10-CM

## 2011-11-24 DIAGNOSIS — R300 Dysuria: Secondary | ICD-10-CM

## 2011-11-24 DIAGNOSIS — M5126 Other intervertebral disc displacement, lumbar region: Secondary | ICD-10-CM

## 2011-11-24 DIAGNOSIS — F3289 Other specified depressive episodes: Secondary | ICD-10-CM

## 2011-11-24 DIAGNOSIS — R42 Dizziness and giddiness: Secondary | ICD-10-CM

## 2011-11-24 DIAGNOSIS — M545 Low back pain, unspecified: Secondary | ICD-10-CM

## 2011-11-24 DIAGNOSIS — R531 Weakness: Secondary | ICD-10-CM

## 2011-11-24 DIAGNOSIS — R269 Unspecified abnormalities of gait and mobility: Secondary | ICD-10-CM

## 2011-11-24 DIAGNOSIS — J309 Allergic rhinitis, unspecified: Secondary | ICD-10-CM

## 2011-11-24 LAB — URINALYSIS, ROUTINE W REFLEX MICROSCOPIC

## 2011-11-24 MED ORDER — CEPHALEXIN 500 MG PO CAPS: 500.0000 mg | ORAL_CAPSULE | Freq: Four times a day (QID) | ORAL | Status: AC

## 2011-11-24 MED ORDER — METHYLPREDNISOLONE ACETATE PF 80 MG/ML IJ SUSP
120.0000 mg | Freq: Once | INTRAMUSCULAR | Status: AC
  Administered 2011-11-24: 120 mg via INTRAMUSCULAR

## 2011-11-24 MED ORDER — FLUTICASONE PROPIONATE 50 MCG/ACT NA SUSP: 2.0000 | Freq: Every day | NASAL | Status: DC

## 2011-11-24 MED ORDER — DULOXETINE HCL 60 MG PO CPEP: 60.0000 mg | ORAL_CAPSULE | Freq: Every day | ORAL | Status: DC

## 2011-11-24 MED ORDER — TRAMADOL HCL 50 MG PO TABS: 50.0000 mg | ORAL_TABLET | Freq: Four times a day (QID) | ORAL | Status: AC | PRN

## 2011-11-24 NOTE — Assessment & Plan Note (Signed)
Mild to mod, labs reviewed with pt from last visit,  to f/u any worsening symptoms or concerns  Lab Results  Component Value Date   WBC 7.9 06/10/2011   HGB 13.9 06/10/2011   HCT 42.6 06/10/2011   PLT 201.0 06/10/2011   GLUCOSE 101* 06/10/2011   CHOL 206* 05/11/2011   TRIG 92.0 05/11/2011   HDL 50.70 05/11/2011   LDLDIRECT 149.2 05/11/2011   LDLCALC 130* 05/28/2009   ALT 19 06/10/2011   AST 20 06/10/2011   NA 139 06/10/2011   K 4.5 06/10/2011   CL 108 06/10/2011   CREATININE 1.1 06/10/2011   BUN 14 06/10/2011   CO2 26 06/10/2011   TSH 0.72 06/10/2011   HGBA1C 5.9 06/10/2011

## 2011-11-24 NOTE — Progress Notes (Signed)
Subjective:    Patient ID: [name], female    DOB: [birth year], 78 y.o.   MRN: [id number]  HPI  Here to f/u; c/o numerous complaints, feels chronic weak and ill perpt, recently hospd with s/s c/w BPV, meclizine helps since d/c.  Does have several wks ongoing nasal allergy symptoms with clear congestion, itch and sneeze, without fever, pain, ST, cough or wheezing.  Does have mild dysuria, but  Denies urinary symptoms such as frequency, urgency,or hematuria, for 2 days.   Pt denies polydipsia, polyuria.  Pt continues to have recurring right LBP without change in severity, bowel or bladder change, fever, wt loss,  worsening LE pain/numbness/weakness, gait change or falls.  Denies worsening depressive symptoms, suicidal ideation, or panic, except does have mild persistent depressive symptoms on current tx.  Feels overall some worsening recently with general weakness.  Past Medical History  Diagnosis Date  . ALLERGIC RHINITIS   . ANEMIA-NOS   . ANXIETY   . CERVICAL CANCER, HX OF   . DEGENERATIVE DISC DISEASE, LUMBOSACRAL SPINE W/RADICULOPATHY   . DEGENERATIVE JOINT DISEASE, ANKLE   . DEPRESSION   . GASTROENTERITIS/COLITIS D/T RADIATION   . GERD   . GLUCOSE INTOLERANCE   . IBS   . MALNUTRITION   . MENOPAUSAL DISORDER   . OSTEOPOROSIS    Past Surgical History  Procedure Date  . Abdominal hysterectomy   . (l) hip fracture   . S/p sb resection and stricturoplasty 09/2000  . Appendectomy   . Oophorectomy     reports that she has never smoked. She does not have any smokeless tobacco history on file. She reports that she does not drink alcohol or use illicit drugs. family history includes Lung cancer in her daughter. No Known Allergies Current Outpatient Prescriptions on File Prior to Visit  Medication Sig Dispense Refill  . DULoxetine (CYMBALTA) 60 MG capsule Take 1 capsule (60 mg total) by mouth daily.  30 capsule  11  . LORazepam (ATIVAN) 0.5 MG tablet Take 0.5 mg by mouth 3 (three)  times daily as needed.        Marland Kitchen omeprazole (PRILOSEC) 20 MG capsule TAKE 2 CAPSULES BY MOUTH EVERY DAY  180 capsule  2  . phenazopyridine (PYRIDIUM) 200 MG tablet Take 200 mg by mouth every 6 (six) hours as needed.      . pravastatin (PRAVACHOL) 40 MG tablet Take 1 by mouth in the evening      . cyclobenzaprine (FLEXERIL) 5 MG tablet 1-2 tabs by mouth at night for cramps as needed  60 tablet  2  . dimenhyDRINATE (MOTION SICKNESS) 50 MG tablet Take 1 tablet (50 mg total) by mouth every 8 (eight) hours as needed.  30 tablet  0   Review of Systems Review of Systems  Constitutional: Negative for diaphoresis and unexpected weight change.  HENT: Negative for drooling and tinnitus.   Eyes: Negative for photophobia and visual disturbance.  Respiratory: Negative for choking and stridor.   Gastrointestinal: Negative for vomiting and blood in stool.  Genitourinary: Negative for hematuria and decreased urine volume.    Objective:   Physical Exam BP 142/80  Pulse 69  Temp(Src) 98.1 F (36.7 C) (Oral)  Ht 5\' 2"  (1.575 m)  Wt 138 lb (62.596 kg)  BMI 25.24 kg/m2  SpO2 95% Physical Exam  VS noted, fatigued, chronic ill appearing, depressive demeanor, 1+ nervous Constitutional: Pt appears well-developed and well-nourished.  HENT: Head: Normocephalic.  Right Ear: External ear normal.  Left Ear: External  ear normal.  /Bilat tm's mild erythema.  Sinus nontender.  Pharynx mild erythema Eyes: Conjunctivae and EOM are normal. Pupils are equal, round, and reactive to light.  Neck: Normal range of motion. Neck supple.  Cardiovascular: Normal rate and regular rhythm.   Pulmonary/Chest: Effort normal and breath sounds normal.  Abd:  Soft, NT, non-distended, + BS, no flank tender Spine nontender; gait antalgic. Slowed, Neurological: Pt is alert. No cranial nerve deficit.  Skin: Skin is warm. No erythema.  Psychiatric: Pt behavior is normal. Thought content normal.     Assessment & Plan:

## 2011-11-24 NOTE — Patient Instructions (Addendum)
You had the steroid shot today for the allergies OK to stop the tylenol with codeine Take all new medications as prescribed  - the tramadol for back pain and other pain, flonase for allergies, the cymbalta at 60 mg per day which is supposed to help with pain and depression Continue all other medications as before, including the meclizine for the vertigo dizziness You will be contacted regarding the referral for: [facility] for weakness and walking difficulty Please go to LAB in the Basement for the urine tests to be done today Please call the phone number [phone] (the PhoneTree System) for results of testing in 2-3 days;  When calling, simply dial the number, and when prompted enter the MRN number above (the Medical Record Number) and the # key, then the message should start. Please return in 6 months, or sooner if needed

## 2011-11-24 NOTE — Telephone Encounter (Signed)
Left message on phone tree  - ua c/w uti, urine cx pending  Robin to contact pt- - for cephalexin course  Done escript

## 2011-11-25 ENCOUNTER — Other Ambulatory Visit: Payer: Self-pay

## 2011-11-25 MED ORDER — DIMENHYDRINATE 50 MG PO TABS: 50.0000 mg | ORAL_TABLET | Freq: Three times a day (TID) | ORAL | Status: DC | PRN

## 2011-11-25 MED ORDER — CYCLOBENZAPRINE HCL 5 MG PO TABS: ORAL_TABLET | ORAL | Status: DC

## 2011-11-25 NOTE — Telephone Encounter (Signed)
Patient informed of urine results

## 2011-11-26 ENCOUNTER — Other Ambulatory Visit: Payer: Self-pay | Admitting: Physical Medicine & Rehabilitation

## 2011-11-27 LAB — URINE CULTURE: Colony Count: >=100000

## 2011-11-28 ENCOUNTER — Encounter: Payer: Self-pay | Admitting: Internal Medicine

## 2011-11-28 NOTE — Assessment & Plan Note (Addendum)
Mild to mod, for flonase asd after depomedrol IM today,  to f/u any worsening symptoms or concerns   Note: total time for pt hx, exam, review of hosp d/c with pt in room, determination of diagnosis, and plan for evaluation and tx is > 40 min

## 2011-11-28 NOTE — Assessment & Plan Note (Signed)
C/ BPV, to cont the meclizine prn, to f/u any worsening symptoms or concerns

## 2011-11-28 NOTE — Assessment & Plan Note (Signed)
Ok for referral to Outpt rehab,  to f/u any worsening symptoms or concerns

## 2011-11-28 NOTE — Assessment & Plan Note (Signed)
Exam no change, for increase the cymbalta as above,  to f/u any worsening symptoms or concerns

## 2011-11-28 NOTE — Assessment & Plan Note (Signed)
For cymbalta incr to 60 mg,  to f/u any worsening symptoms or concerns, declines cousneling, verified nonsuicidal

## 2011-11-28 NOTE — Assessment & Plan Note (Signed)
Mild to mod, for urine studies, consider antibx,  to f/u any worsening symptoms or concerns  

## 2011-12-02 ENCOUNTER — Telehealth: Payer: Self-pay

## 2011-12-02 ENCOUNTER — Other Ambulatory Visit: Payer: Self-pay | Admitting: Internal Medicine

## 2011-12-02 DIAGNOSIS — G629 Polyneuropathy, unspecified: Secondary | ICD-10-CM | POA: Insufficient documentation

## 2011-12-02 HISTORY — DX: Polyneuropathy, unspecified: G62.9

## 2011-12-02 MED ORDER — GABAPENTIN 300 MG PO CAPS: 300.0000 mg | ORAL_CAPSULE | Freq: Three times a day (TID) | ORAL | Status: DC

## 2011-12-02 NOTE — Telephone Encounter (Signed)
Done per emr 

## 2011-12-02 NOTE — Telephone Encounter (Signed)
Patient called requesting a refill on Gabapentin, Please advise

## 2012-01-11 ENCOUNTER — Other Ambulatory Visit: Payer: Self-pay | Admitting: Internal Medicine

## 2012-01-23 ENCOUNTER — Emergency Department (HOSPITAL_COMMUNITY): Payer: Medicare Other

## 2012-01-23 ENCOUNTER — Emergency Department (HOSPITAL_COMMUNITY)
Admission: EM | Admit: 2012-01-23 | Discharge: 2012-01-23 | Disposition: A | Discharge status: Home / Self Care | Payer: Medicare Other | Attending: Emergency Medicine | Admitting: Emergency Medicine

## 2012-01-23 ENCOUNTER — Encounter (HOSPITAL_COMMUNITY): Payer: Self-pay

## 2012-01-23 DIAGNOSIS — W1809XA Striking against other object with subsequent fall, initial encounter: Secondary | ICD-10-CM | POA: Insufficient documentation

## 2012-01-23 DIAGNOSIS — G319 Degenerative disease of nervous system, unspecified: Secondary | ICD-10-CM | POA: Insufficient documentation

## 2012-01-23 DIAGNOSIS — S0180XA Unspecified open wound of other part of head, initial encounter: Secondary | ICD-10-CM | POA: Insufficient documentation

## 2012-01-23 DIAGNOSIS — S0181XA Laceration without foreign body of other part of head, initial encounter: Secondary | ICD-10-CM

## 2012-01-23 DIAGNOSIS — W1781XA Fall down embankment (hill), initial encounter: Secondary | ICD-10-CM

## 2012-01-23 DIAGNOSIS — S0003XA Contusion of scalp, initial encounter: Secondary | ICD-10-CM | POA: Insufficient documentation

## 2012-01-23 DIAGNOSIS — S62309A Unspecified fracture of unspecified metacarpal bone, initial encounter for closed fracture: Secondary | ICD-10-CM | POA: Insufficient documentation

## 2012-01-23 DIAGNOSIS — M899 Disorder of bone, unspecified: Secondary | ICD-10-CM | POA: Insufficient documentation

## 2012-01-23 DIAGNOSIS — M949 Disorder of cartilage, unspecified: Secondary | ICD-10-CM | POA: Insufficient documentation

## 2012-01-23 MED ORDER — HYDROCODONE-ACETAMINOPHEN 5-325 MG PO TABS
1.0000 | ORAL_TABLET | Freq: Once | ORAL | Status: AC
  Administered 2012-01-23: 1 via ORAL
  Filled 2012-01-23: qty 1

## 2012-01-23 MED ORDER — HYDROCODONE-ACETAMINOPHEN 5-325 MG PO TABS: 1.0000 | ORAL_TABLET | Freq: Four times a day (QID) | ORAL | Status: AC | PRN

## 2012-01-23 NOTE — ED Notes (Signed)
Skin tears to left forehead and left knee cleaned with saline and dressing placed over skin tear to forehead due to scant amount of blood.

## 2012-01-23 NOTE — ED Notes (Signed)
Pt in from home with fall and laceration to the head also c/o left hand pain denies loc pt presents to the ED with hematoma to the left side of forehead bleeding controlled dressing applied

## 2012-01-23 NOTE — ED Notes (Signed)
Pt also has an abrasion/skin tear to the left knee

## 2012-01-23 NOTE — ED Provider Notes (Signed)
History     CSN: [id number]  Arrival date & time 01/23/12  1553   First MD Initiated Contact with Patient 01/23/12 1711      Chief Complaint  Patient presents with  . Fall  . Head Laceration  . Hand Pain    (Consider location/radiation/quality/duration/timing/severity/associated sxs/prior treatment) The history is provided by the patient.  78 yo female who presents to ED after fall.  Fall happened ~2.5 hours prior to arrival to ED.  States she was taking pictures with family today and was standing on the side of a hill.  She took a wrong step and fell catching her fall with her L hand and knee and hitting her forehead on the concrete.  She is complaining of L hand pain with pain in the ring finger of the left hand.  She has mild head pain.  She denies knee or shoulder pain.  There was no LOC and she denies any prodromal symptoms or dizziness contributing to her fall.   Last tetanus 05/2009  Past Medical History  Diagnosis Date  . ALLERGIC RHINITIS   . ANEMIA-NOS   . ANXIETY   . CERVICAL CANCER, HX OF   . DEGENERATIVE DISC DISEASE, LUMBOSACRAL SPINE W/RADICULOPATHY   . DEGENERATIVE JOINT DISEASE, ANKLE   . DEPRESSION   . GASTROENTERITIS/COLITIS D/T RADIATION   . GERD   . GLUCOSE INTOLERANCE   . IBS   . MALNUTRITION   . MENOPAUSAL DISORDER   . OSTEOPOROSIS   . Peripheral neuropathy 12/02/2011    Past Surgical History  Procedure Date  . Abdominal hysterectomy   . (l) hip fracture   . S/p sb resection and stricturoplasty 09/2000  . Appendectomy   . Oophorectomy     Family History  Problem Relation Age of Onset  . Lung cancer Daughter     History  Substance Use Topics  . Smoking status: Never Smoker   . Smokeless tobacco: Not on file  . Alcohol Use: No    OB History    Grav Para Term Preterm Abortions TAB SAB Ect Mult Living                  Review of Systems  Constitutional: Negative for fever and fatigue.  HENT: Negative for neck pain and neck stiffness.     Eyes: Negative for visual disturbance.  Respiratory: Negative for chest tightness and shortness of breath.   Cardiovascular: Negative for chest pain.  Gastrointestinal: Negative for nausea, vomiting and abdominal pain.  Musculoskeletal: Positive for myalgias and arthralgias.  Skin: Positive for color change (Concerned about change in color on feet.  has been slowly changing over the past few weeks.  States she was evaluated by foot specialist and was told everything was ok. ).  Neurological: Negative for dizziness, syncope, speech difficulty, weakness and headaches.    Allergies  Review of patient's allergies indicates no known allergies.  Home Medications   Current Outpatient Rx  Name Route Sig Dispense Refill  . [facility] MOTION SICKNESS 50 MG PO TABS  TAKE 1 TABLET BY MOUTH EVERY 8 HOURS AS NEEDED 36 tablet 0  . CYCLOBENZAPRINE HCL 5 MG PO TABS  1-2 tabs by mouth at night for cramps as needed 60 tablet 2  . DULOXETINE HCL 60 MG PO CPEP Oral Take 1 capsule (60 mg total) by mouth daily. 30 capsule 11  . FLUTICASONE PROPIONATE 50 MCG/ACT NA SUSP Nasal Place 2 sprays into the nose daily. 16 g 2  . GABAPENTIN  300 MG PO CAPS Oral Take 1 capsule (300 mg total) by mouth 3 (three) times daily. 90 capsule 5  . LORAZEPAM 0.5 MG PO TABS Oral Take 0.5 mg by mouth 3 (three) times daily as needed.      . MECLIZINE HCL 25 MG PO TABS  TAKE 1 TABLET BY MOUTH 3 TIMES A DAY AS NEEDED 30 tablet 1  . OMEPRAZOLE 20 MG PO CPDR  TAKE 2 CAPSULES BY MOUTH EVERY DAY 180 capsule 2  . PHENAZOPYRIDINE HCL 200 MG PO TABS Oral Take 200 mg by mouth every 6 (six) hours as needed.    Marland Kitchen PRAVASTATIN SODIUM 40 MG PO TABS  Take 1 by mouth in the evening      BP 156/87  Pulse 82  Temp(Src) 98 F (36.7 C) (Oral)  Resp 20  SpO2 93%  Physical Exam  Constitutional: She is oriented to person, place, and time. No distress.  HENT:  Right Ear: Tympanic membrane, external ear and ear canal normal.  Left Ear: Tympanic membrane,  external ear and ear canal normal.  Mouth/Throat: Oropharynx is clear and moist.       Abrasion with skin flap on L forehead with underlying hematoma.    Eyes: Conjunctivae and EOM are normal. Pupils are equal, round, and reactive to light.  Neck: No spinous process tenderness present.  Cardiovascular: Normal rate, regular rhythm and normal heart sounds.   Pulmonary/Chest: Effort normal and breath sounds normal.  Musculoskeletal:       Left hand: She exhibits tenderness, bony tenderness and swelling.       Hands: Neurological: She is alert and oriented to person, place, and time. No cranial nerve deficit.  Skin: Abrasion noted.       Abrasion on L knee and forehead with skin flaps    ED Course  LACERATION REPAIR Date/Time: 01/23/2012 7:51 PM Performed by: Everrett Coombe Authorized by: Nelia Shi Consent: Verbal consent obtained. Risks and benefits: risks, benefits and alternatives were discussed Consent given by: patient Patient understanding: patient states understanding of the procedure being performed Patient consent: the patient's understanding of the procedure matches consent given Procedure consent: procedure consent matches procedure scheduled Relevant documents: relevant documents present and verified Test results: test results available and properly labeled Site marked: the operative site was marked Required items: required blood products, implants, devices, and special equipment available Patient identity confirmed: verbally with patient Time out: Immediately prior to procedure a "time out" was called to verify the correct patient, procedure, equipment, support staff and site/side marked as required. Body area: head/neck Location details: forehead Laceration length: 3 cm Contamination: The wound is contaminated. Foreign bodies: no foreign bodies Tendon involvement: none Nerve involvement: none Vascular damage: no Anesthesia: local infiltration Local anesthetic:  lidocaine 1% without epinephrine Anesthetic total: 5 ml Patient sedated: no Irrigation solution: saline Irrigation method: syringe Amount of cleaning: standard Debridement: minimal Degree of undermining: none Skin closure: 6-0 nylon Number of sutures: 5 Technique: simple Approximation: close Approximation difficulty: simple Dressing: antibiotic ointment Patient tolerance: Patient tolerated the procedure well with no immediate complications.   (including critical care time)  Labs Reviewed - No data to display No results found.   No diagnosis found.    MDM  Elderly female with traumatic fall with head trauma and hand trauma-CT head and L wrist/hand x-rays.  -CT without bleeding, fracture  -Hand x-ray with fracture of 4th and 5th metacarpal with base of 5th involvement.   -hand fx  -Brace placed  -  Will have her f/u with ortho, states she has been seen by [facility] before.   -Abrasion on Forehead and Knee  -After adequate anesthesia and irrigation, a small laceration visualized.  Repaired with 6-0  nylon.  See procedure note.  -Norco for pain control       Everrett Coombe, DO 01/23/12 2010

## 2012-01-23 NOTE — ED Notes (Signed)
Resident cleaned head and knee wounds.  Stitches applied to head with antibiotic ointment..  Dressing on knee. Pt alert and oriented.  Ortho at bedside to splint hand.

## 2012-01-24 NOTE — ED Provider Notes (Signed)
I saw and evaluated the patient, reviewed the resident's note and I agree with the findings and plan.   .Face to face Exam:  General:  Alert Resp:  Normal effort Abd:  Nondistended Neuro:No focal weakness Lymph: No adenopathy   Nelia Shi, MD 01/24/12 [phone]

## 2012-05-10 ENCOUNTER — Ambulatory Visit: Payer: Medicare Other | Admitting: Internal Medicine

## 2012-05-11 ENCOUNTER — Ambulatory Visit: Payer: Medicare Other | Admitting: Internal Medicine

## 2012-05-15 ENCOUNTER — Ambulatory Visit: Payer: Medicare Other | Admitting: Internal Medicine

## 2012-05-15 DIAGNOSIS — Z0289 Encounter for other administrative examinations: Secondary | ICD-10-CM

## 2012-05-18 ENCOUNTER — Ambulatory Visit (INDEPENDENT_AMBULATORY_CARE_PROVIDER_SITE_OTHER): Payer: Medicare Other | Admitting: Internal Medicine

## 2012-05-18 ENCOUNTER — Other Ambulatory Visit (INDEPENDENT_AMBULATORY_CARE_PROVIDER_SITE_OTHER): Payer: Medicare Other

## 2012-05-18 ENCOUNTER — Encounter: Payer: Self-pay | Admitting: Internal Medicine

## 2012-05-18 ENCOUNTER — Other Ambulatory Visit: Payer: Self-pay | Admitting: Internal Medicine

## 2012-05-18 VITALS — BP 148/90 | HR 77 | Temp 98.1°F | Wt 140.0 lb

## 2012-05-18 DIAGNOSIS — E785 Hyperlipidemia, unspecified: Secondary | ICD-10-CM | POA: Insufficient documentation

## 2012-05-18 DIAGNOSIS — M545 Low back pain: Secondary | ICD-10-CM

## 2012-05-18 DIAGNOSIS — D649 Anemia, unspecified: Secondary | ICD-10-CM

## 2012-05-18 DIAGNOSIS — R7302 Impaired glucose tolerance (oral): Secondary | ICD-10-CM

## 2012-05-18 DIAGNOSIS — R5383 Other fatigue: Secondary | ICD-10-CM

## 2012-05-18 DIAGNOSIS — R5381 Other malaise: Secondary | ICD-10-CM

## 2012-05-18 DIAGNOSIS — E538 Deficiency of other specified B group vitamins: Secondary | ICD-10-CM

## 2012-05-18 DIAGNOSIS — R7309 Other abnormal glucose: Secondary | ICD-10-CM

## 2012-05-18 DIAGNOSIS — R300 Dysuria: Secondary | ICD-10-CM

## 2012-05-18 HISTORY — DX: Hyperlipidemia, unspecified: E78.5

## 2012-05-18 LAB — CBC WITH DIFFERENTIAL/PLATELET
Basophils Absolute: 0.1 10*3/uL (ref 0.0–0.1)
HCT: 41.6 % (ref 36.0–46.0)
Hemoglobin: 13.2 g/dL (ref 12.0–15.0)
Lymphs Abs: 1.1 10*3/uL (ref 0.7–4.0)
MCHC: 31.7 g/dL (ref 30.0–36.0)
Monocytes Relative: 8 % (ref 3.0–12.0)
Neutro Abs: 6.7 10*3/uL (ref 1.4–7.7)
RDW: 14.6 % (ref 11.5–14.6)

## 2012-05-18 LAB — URINALYSIS, ROUTINE W REFLEX MICROSCOPIC
Ketones, ur: NEGATIVE
Specific Gravity, Urine: 1.01 (ref 1.000–1.030)
Urine Glucose: NEGATIVE
pH: 6 (ref 5.0–8.0)

## 2012-05-18 LAB — HEMOGLOBIN A1C: Hgb A1c MFr Bld: 5 % (ref 4.6–6.5)

## 2012-05-18 MED ORDER — GABAPENTIN 300 MG PO CAPS: ORAL_CAPSULE | ORAL | Status: DC

## 2012-05-18 MED ORDER — ONDANSETRON HCL 4 MG PO TABS: 4.0000 mg | ORAL_TABLET | Freq: Three times a day (TID) | ORAL | Status: AC | PRN

## 2012-05-18 MED ORDER — TRAMADOL HCL 50 MG PO TABS: 100.0000 mg | ORAL_TABLET | Freq: Four times a day (QID) | ORAL | Status: DC | PRN

## 2012-05-18 MED ORDER — HYDROCORTISONE ACETATE 25 MG RE SUPP: 25.0000 mg | Freq: Two times a day (BID) | RECTAL | Status: AC

## 2012-05-18 MED ORDER — NITROFURANTOIN MACROCRYSTAL 50 MG PO CAPS: 50.0000 mg | ORAL_CAPSULE | Freq: Every day | ORAL | Status: AC

## 2012-05-18 NOTE — Assessment & Plan Note (Signed)
Etiology unclear, Exam otherwise benign, to check labs as documented, follow with expectant management  

## 2012-05-18 NOTE — Assessment & Plan Note (Signed)
Chronic recurrent, pt states nsiads, tramadol 50, vicodin not helped, will incr the tramadol to 1-2 qid prn,.  to f/u any worsening symptoms or concerns

## 2012-05-18 NOTE — Assessment & Plan Note (Signed)
stable overall by hx and exam, most recent data reviewed with pt, and pt to continue medical treatment as before Lab Results  Component Value Date   WBC 7.9 06/10/2011   HGB 13.9 06/10/2011   HCT 42.6 06/10/2011   MCV 92.5 06/10/2011   PLT 201.0 06/10/2011

## 2012-05-18 NOTE — Assessment & Plan Note (Signed)
Mild to mod, for antibx course,  to f/u any worsening symptoms or concerns, for urine cx 

## 2012-05-18 NOTE — Assessment & Plan Note (Signed)
Asympt, for a1c 

## 2012-05-18 NOTE — Assessment & Plan Note (Signed)
stable overall by hx and exam, most recent data reviewed with pt, and pt to continue medical treatment as before Lab Results  Component Value Date   LDLCALC 130* 05/28/2009

## 2012-05-18 NOTE — Progress Notes (Signed)
Subjective:    Patient ID: [name], female    DOB: 19-Mar-1925, 78 y.o.   MRN: [id number]  HPI  Here with daughter with mult c/o's, including dysuria for 3 days, with recurrent uti's in the past yr.  Also c/o constipatoin and pain with BM, no blood; Denies urinary symptoms such as frequency, urgency,or hematuria, flank or back pain more than usual;  Pt continues to have recurring LBP without change in severity, bowel or bladder change, fever, wt loss,  worsening LE pain/numbness/weakness, gait change or falls.  Also with chronic nausea.  Has seen numerous specialists including urology and neurology, has chronic dizziness and c/o numbpain to toes to going to sleep at night.  C/o chronic pain, and most tx for her ailments dont seem to be well understood or treatable.  Daughter exasperated with her chronic illness and symptoms.  Pt denies chest pain, increased sob or doe, wheezing, orthopnea, PND, increased LE swelling, palpitations, dizziness or syncope.   Pt denies polydipsia, polyuria. Denies worsening depressive symptoms, suicidal ideation, or panic, though has ongoing anxiety.  Does have sense of ongoing fatigue, but denies signficant hypersomnolence.  Past Medical History  Diagnosis Date  . ALLERGIC RHINITIS   . ANEMIA-NOS   . ANXIETY   . CERVICAL CANCER, HX OF   . DEGENERATIVE DISC DISEASE, LUMBOSACRAL SPINE W/RADICULOPATHY   . DEGENERATIVE JOINT DISEASE, ANKLE   . DEPRESSION   . GASTROENTERITIS/COLITIS D/T RADIATION   . GERD   . GLUCOSE INTOLERANCE   . IBS   . MALNUTRITION   . MENOPAUSAL DISORDER   . OSTEOPOROSIS   . Peripheral neuropathy 12/02/2011  . Hyperlipidemia 05/18/2012   Past Surgical History  Procedure Date  . Abdominal hysterectomy   . (l) hip fracture   . S/p sb resection and stricturoplasty 09/2000  . Appendectomy   . Oophorectomy     reports that she has never smoked. She does not have any smokeless tobacco history on file. She reports that she does not drink  alcohol or use illicit drugs. family history includes Lung cancer in her daughter. No Known Allergies Current Outpatient Prescriptions on File Prior to Visit  Medication Sig Dispense Refill  . [facility] MOTION SICKNESS 50 MG tablet TAKE 1 TABLET BY MOUTH EVERY 8 HOURS AS NEEDED  36 tablet  0  . cyclobenzaprine (FLEXERIL) 5 MG tablet 1-2 tabs by mouth at night for cramps as needed  60 tablet  2  . DULoxetine (CYMBALTA) 60 MG capsule Take 1 capsule (60 mg total) by mouth daily.  30 capsule  11  . fluticasone (FLONASE) 50 MCG/ACT nasal spray Place 2 sprays into the nose daily.  16 g  2  . LORazepam (ATIVAN) 0.5 MG tablet Take 0.5 mg by mouth 3 (three) times daily as needed.        . meclizine (ANTIVERT) 25 MG tablet TAKE 1 TABLET BY MOUTH 3 TIMES A DAY AS NEEDED  30 tablet  1  . omeprazole (PRILOSEC) 20 MG capsule TAKE 2 CAPSULES BY MOUTH EVERY DAY  180 capsule  2  . phenazopyridine (PYRIDIUM) 200 MG tablet Take 200 mg by mouth every 6 (six) hours as needed.      . pravastatin (PRAVACHOL) 40 MG tablet Take 1 by mouth in the evening      . DISCONTD: gabapentin (NEURONTIN) 300 MG capsule Take 1 capsule (300 mg total) by mouth 3 (three) times daily.  90 capsule  5   Review of Systems Review of Systems  Constitutional: Negative for unexpected weight change.  HENT: Negative for drooling and tinnitus.   Eyes: Negative for photophobia  Respiratory: Negative for choking and stridor.   Gastrointestinal: Negative for vomiting and blood in stool.  Genitourinary: Negative for hematuria and decreased urine volume.  Musculoskeletal: Negative for acute joint swelling  Skin: Negative for wound, but has marked distal feet bluish discoloration , mild coolness  Neurological: Negative for tremors, but with numbness as above Psychiatric/Behavioral: Negative for decreased concentration. The patient is not hyperactive.      Objective:   Physical Exam BP 148/90  Pulse 77  Temp 98.1 F (36.7 C) (Oral)  Wt 140 lb  (63.504 kg)  SpO2 95% Physical Exam  VS noted Constitutional: Pt appears well-developed and well-nourished.  HENT: Head: Normocephalic.  Right Ear: External ear normal.  Left Ear: External ear normal.  Eyes: Conjunctivae and EOM are normal. Pupils are equal, round, and reactive to light.  Neck: Normal range of motion. Neck supple.  Cardiovascular: Normal rate and regular rhythm.   Pulmonary/Chest: Effort normal and breath sounds normal.  Abd:  Soft, NT, non-distended, + BS Neurological: Pt is alert. Not confused, has distal toe numb to LT Skin: Skin is warm. No erythema. No rash but marked bluish distal feet color due to severe varicosities Psychiatric: Pt behavior is chronic dysphoric, Thought content ok    Assessment & Plan:

## 2012-05-18 NOTE — Patient Instructions (Addendum)
Take all new medications as prescribed Continue all other medications as before Please go to LAB in the Basement for the blood and/or urine tests to be done today You will be contacted by phone if any changes need to be made immediately.  Otherwise, you will receive a letter about your results with an explanation. Please return in 6 months, or sooner if needed

## 2012-05-19 LAB — LIPID PANEL
Cholesterol: 171 mg/dL (ref 0–200)
HDL: 59.2 mg/dL (ref >39.00)
Triglycerides: 100 mg/dL (ref 0.0–149.0)
VLDL: 20 mg/dL (ref 0.0–40.0)

## 2012-05-19 LAB — HEPATIC FUNCTION PANEL
Albumin: 3.8 g/dL (ref 3.5–5.2)
Alkaline Phosphatase: 59 U/L (ref 39–117)
Bilirubin, Direct: 0.1 mg/dL (ref 0.0–0.3)

## 2012-05-19 LAB — BASIC METABOLIC PANEL
Calcium: 9.1 mg/dL (ref 8.4–10.5)
GFR: 42.13 mL/min — ABNORMAL LOW (ref >60.00)
Sodium: 142 mEq/L (ref 135–145)

## 2012-05-19 LAB — IBC PANEL: Saturation Ratios: 17.4 % — ABNORMAL LOW (ref 20.0–50.0)

## 2012-05-23 LAB — PROTEIN ELECTROPHORESIS, SERUM
Albumin ELP: 57.6 % (ref 55.8–66.1)
Alpha-1-Globulin: 5.3 % — ABNORMAL HIGH (ref 2.9–4.9)
Alpha-2-Globulin: 13.3 % — ABNORMAL HIGH (ref 7.1–11.8)
Gamma Globulin: 10 % — ABNORMAL LOW (ref 11.1–18.8)

## 2012-05-24 ENCOUNTER — Other Ambulatory Visit: Payer: Self-pay | Admitting: Internal Medicine

## 2012-05-24 ENCOUNTER — Encounter: Payer: Self-pay | Admitting: Internal Medicine

## 2012-05-24 DIAGNOSIS — E538 Deficiency of other specified B group vitamins: Secondary | ICD-10-CM

## 2012-05-24 DIAGNOSIS — R778 Other specified abnormalities of plasma proteins: Secondary | ICD-10-CM

## 2012-05-24 LAB — URINE CULTURE: Colony Count: 6000

## 2012-05-24 NOTE — Telephone Encounter (Signed)
The test results show that your current treatment is OK, except the last test above called the SPEP indicates a possible abnormal elevated protein, somewhat suggestive of a problem that could represent multiple myeloma (although this is Definitely NOT clear at this time, and could just as well not be a real problem).  To look into this, we should do the "next step" which another "better" test called the Serum IFE.  You should be contacted about this by Zella Ball as well.  Also the B12 level was not ordered correctly the first time, so this will need to be done as well.  Please return to the lab at your convenience.    There is no other need for change of treatment or further evaluation based on the other results which are normal or stable.  Please continue the same plan for further evaluation and treatment as discussed at your office visit.

## 2012-05-26 ENCOUNTER — Ambulatory Visit: Payer: Medicare Other | Admitting: Internal Medicine

## 2012-05-26 DIAGNOSIS — Z0289 Encounter for other administrative examinations: Secondary | ICD-10-CM

## 2012-05-30 ENCOUNTER — Telehealth: Payer: Self-pay | Admitting: Internal Medicine

## 2012-05-30 ENCOUNTER — Encounter: Payer: Self-pay | Admitting: Internal Medicine

## 2012-05-30 ENCOUNTER — Other Ambulatory Visit (INDEPENDENT_AMBULATORY_CARE_PROVIDER_SITE_OTHER): Payer: Medicare Other

## 2012-05-30 DIAGNOSIS — R778 Other specified abnormalities of plasma proteins: Secondary | ICD-10-CM

## 2012-05-30 DIAGNOSIS — E538 Deficiency of other specified B group vitamins: Secondary | ICD-10-CM

## 2012-05-30 NOTE — Telephone Encounter (Signed)
Labs re-ordered

## 2012-05-30 NOTE — Telephone Encounter (Signed)
Message copied by Corwin Levins on Tue May 30, 2012  4:46 PM ------      Message from: Pincus Sanes      Created: Tue May 30, 2012  3:57 PM       Patient is in the lab today they drew blood, but test had expired.  Please add Serum IFE and B12.  The lab stated the Serum IFE was not ordered correctly

## 2012-06-01 ENCOUNTER — Other Ambulatory Visit: Payer: Self-pay | Admitting: Internal Medicine

## 2012-06-01 ENCOUNTER — Encounter: Payer: Self-pay | Admitting: Internal Medicine

## 2012-06-01 DIAGNOSIS — D8989 Other specified disorders involving the immune mechanism, not elsewhere classified: Secondary | ICD-10-CM

## 2012-06-01 LAB — KAPPA/LAMBDA LIGHT CHAINS
Kappa free light chain: 2.25 mg/dL — ABNORMAL HIGH (ref 0.33–1.94)
Lambda Free Lght Chn: 2.45 mg/dL (ref 0.57–2.63)

## 2012-06-08 LAB — MULTIPLE MYELOMA PANEL, SERUM
Alpha-2-Globulin: 14.7 % — ABNORMAL HIGH (ref 7.1–11.8)
Beta 2: 7 % — ABNORMAL HIGH (ref 3.2–6.5)
Beta Globulin: 6.9 % (ref 4.7–7.2)
Gamma Globulin: 9.6 % — ABNORMAL LOW (ref 11.1–18.8)

## 2012-06-14 ENCOUNTER — Other Ambulatory Visit: Payer: Self-pay | Admitting: Internal Medicine

## 2012-07-28 ENCOUNTER — Other Ambulatory Visit: Payer: Self-pay | Admitting: Internal Medicine

## 2012-09-09 ENCOUNTER — Other Ambulatory Visit: Payer: Self-pay | Admitting: Internal Medicine

## 2012-12-02 ENCOUNTER — Other Ambulatory Visit: Payer: Self-pay | Admitting: Internal Medicine

## 2013-05-09 ENCOUNTER — Other Ambulatory Visit: Payer: Self-pay | Admitting: Internal Medicine

## 2013-06-21 ENCOUNTER — Other Ambulatory Visit: Payer: Self-pay | Admitting: Internal Medicine

## 2013-06-21 NOTE — Telephone Encounter (Signed)
Done hardcopy to robin  

## 2013-06-21 NOTE — Telephone Encounter (Signed)
Faxed hardcopy to [facility] [address] [place] 

## 2013-07-25 ENCOUNTER — Other Ambulatory Visit: Payer: Self-pay | Admitting: Internal Medicine

## 2013-08-05 ENCOUNTER — Emergency Department (HOSPITAL_COMMUNITY)
Admission: EM | Admit: 2013-08-05 | Discharge: 2013-08-05 | Disposition: A | Discharge status: Home / Self Care | Payer: Medicare Other | Attending: Emergency Medicine | Admitting: Emergency Medicine

## 2013-08-05 ENCOUNTER — Emergency Department (HOSPITAL_COMMUNITY): Payer: Medicare Other

## 2013-08-05 ENCOUNTER — Encounter (HOSPITAL_COMMUNITY): Payer: Self-pay | Admitting: Emergency Medicine

## 2013-08-05 DIAGNOSIS — Z862 Personal history of diseases of the blood and blood-forming organs and certain disorders involving the immune mechanism: Secondary | ICD-10-CM | POA: Insufficient documentation

## 2013-08-05 DIAGNOSIS — F3289 Other specified depressive episodes: Secondary | ICD-10-CM | POA: Insufficient documentation

## 2013-08-05 DIAGNOSIS — R0789 Other chest pain: Secondary | ICD-10-CM

## 2013-08-05 DIAGNOSIS — Z8541 Personal history of malignant neoplasm of cervix uteri: Secondary | ICD-10-CM | POA: Insufficient documentation

## 2013-08-05 DIAGNOSIS — K219 Gastro-esophageal reflux disease without esophagitis: Secondary | ICD-10-CM | POA: Insufficient documentation

## 2013-08-05 DIAGNOSIS — Y9389 Activity, other specified: Secondary | ICD-10-CM | POA: Insufficient documentation

## 2013-08-05 DIAGNOSIS — M5137 Other intervertebral disc degeneration, lumbosacral region: Secondary | ICD-10-CM | POA: Insufficient documentation

## 2013-08-05 DIAGNOSIS — S298XXA Other specified injuries of thorax, initial encounter: Secondary | ICD-10-CM | POA: Insufficient documentation

## 2013-08-05 DIAGNOSIS — Z8639 Personal history of other endocrine, nutritional and metabolic disease: Secondary | ICD-10-CM | POA: Insufficient documentation

## 2013-08-05 DIAGNOSIS — Y9289 Other specified places as the place of occurrence of the external cause: Secondary | ICD-10-CM | POA: Insufficient documentation

## 2013-08-05 DIAGNOSIS — W06XXXA Fall from bed, initial encounter: Secondary | ICD-10-CM | POA: Insufficient documentation

## 2013-08-05 DIAGNOSIS — Z79899 Other long term (current) drug therapy: Secondary | ICD-10-CM | POA: Insufficient documentation

## 2013-08-05 DIAGNOSIS — M51379 Other intervertebral disc degeneration, lumbosacral region without mention of lumbar back pain or lower extremity pain: Secondary | ICD-10-CM | POA: Insufficient documentation

## 2013-08-05 DIAGNOSIS — G609 Hereditary and idiopathic neuropathy, unspecified: Secondary | ICD-10-CM | POA: Insufficient documentation

## 2013-08-05 DIAGNOSIS — F411 Generalized anxiety disorder: Secondary | ICD-10-CM | POA: Insufficient documentation

## 2013-08-05 DIAGNOSIS — Z8742 Personal history of other diseases of the female genital tract: Secondary | ICD-10-CM | POA: Insufficient documentation

## 2013-08-05 DIAGNOSIS — F329 Major depressive disorder, single episode, unspecified: Secondary | ICD-10-CM | POA: Insufficient documentation

## 2013-08-05 DIAGNOSIS — IMO0002 Reserved for concepts with insufficient information to code with codable children: Secondary | ICD-10-CM | POA: Insufficient documentation

## 2013-08-05 DIAGNOSIS — M19079 Primary osteoarthritis, unspecified ankle and foot: Secondary | ICD-10-CM | POA: Insufficient documentation

## 2013-08-05 MED ORDER — IBUPROFEN 200 MG PO TABS
400.0000 mg | ORAL_TABLET | Freq: Once | ORAL | Status: AC
  Administered 2013-08-05: 400 mg via ORAL
  Filled 2013-08-05: qty 2

## 2013-08-05 MED ORDER — OXYCODONE-ACETAMINOPHEN 5-325 MG PO TABS
1.0000 | ORAL_TABLET | Freq: Once | ORAL | Status: AC
  Administered 2013-08-05: 1 via ORAL
  Filled 2013-08-05: qty 1

## 2013-08-05 MED ORDER — OXYCODONE-ACETAMINOPHEN 5-325 MG PO TABS: 1.0000 | ORAL_TABLET | ORAL | Status: DC | PRN

## 2013-08-05 NOTE — ED Notes (Signed)
Discharge instructions explained to pt; instructions and prescription given to patient; pt verbalized understanding instructions.  Pt and care giver cautioned to  Take care and avoid falls.

## 2013-08-05 NOTE — ED Notes (Signed)
Pt reports falling out of bed on to a small trash can; she has pain in right side and states "It catches when I take a deep breath or move a certain way"

## 2013-08-05 NOTE — ED Provider Notes (Signed)
CSN: [id number]     Arrival date & time 08/05/13  0846 History   First MD Initiated Contact with Patient 08/05/13 0859     No chief complaint on file.  (Consider location/radiation/quality/duration/timing/severity/associated sxs/prior Treatment) HPI  79 year old female with right-sided chest pain. Onset approximately 4 days ago. She rolled out of bed and fell onto her right side. She has had persistent chest pain since then. She feels relatively okay at rest. The pain is exacerbated by movements and deep breathing. She denies any actual shortness of breath. No abdominal pain. No headache, neck or back pain. She has not been taking anything for pain aside from ativan, which actually prescribed for anxiety.   Past Medical History  Diagnosis Date  . ALLERGIC RHINITIS   . ANEMIA-NOS   . ANXIETY   . CERVICAL CANCER, HX OF   . DEGENERATIVE DISC DISEASE, LUMBOSACRAL SPINE W/RADICULOPATHY   . DEGENERATIVE JOINT DISEASE, ANKLE   . DEPRESSION   . GASTROENTERITIS/COLITIS D/T RADIATION   . GERD   . GLUCOSE INTOLERANCE   . IBS   . MALNUTRITION   . MENOPAUSAL DISORDER   . OSTEOPOROSIS   . Peripheral neuropathy 12/02/2011  . Hyperlipidemia 05/18/2012   Past Surgical History  Procedure Laterality Date  . Abdominal hysterectomy    . (l) hip fracture    . S/p sb resection and stricturoplasty  09/2000  . Appendectomy    . Oophorectomy     Family History  Problem Relation Age of Onset  . Lung cancer Daughter    History  Substance Use Topics  . Smoking status: Never Smoker   . Smokeless tobacco: Not on file  . Alcohol Use: No   OB History   Grav Para Term Preterm Abortions TAB SAB Ect Mult Living                 Review of Systems  All systems reviewed and negative, other than as noted in HPI.   Allergies  Review of patient's allergies indicates no known allergies.  Home Medications   Current Outpatient Rx  Name  Route  Sig  Dispense  Refill  . [facility] MOTION SICKNESS 50 MG  tablet      TAKE 1 TABLET BY MOUTH EVERY 8 HOURS AS NEEDED   36 tablet   0   . cyclobenzaprine (FLEXERIL) 5 MG tablet      TAKE 1 TO 2 TABLETS BY MOUTH AT BEDTIME FOR CRAMPS AS NEEDED   60 tablet   2   . DULoxetine (CYMBALTA) 60 MG capsule      TAKE ONE CAPSULE BY MOUTH EVERY DAY   30 capsule   0     The patient needs to schedule an office visit   . EXPIRED: fluticasone (FLONASE) 50 MCG/ACT nasal spray   Nasal   Place 2 sprays into the nose daily.   16 g   2   . gabapentin (NEURONTIN) 300 MG capsule      1 tab twice daily and 2 tab by mouth at bedtime   120 capsule   5   . LORazepam (ATIVAN) 0.5 MG tablet   Oral   Take 0.5 mg by mouth 3 (three) times daily as needed.           . meclizine (ANTIVERT) 25 MG tablet      TAKE 1 TABLET BY MOUTH 3 TIMES A DAY AS NEEDED   30 tablet   1   . omeprazole (PRILOSEC) 20 MG capsule  TAKE 2 CAPSULES BY MOUTH EVERY DAY   180 capsule   2   . phenazopyridine (PYRIDIUM) 200 MG tablet   Oral   Take 200 mg by mouth every 6 (six) hours as needed.         . pravastatin (PRAVACHOL) 40 MG tablet      Take 1 by mouth in the evening         . pravastatin (PRAVACHOL) 40 MG tablet      TAKE 1 TABLET BY MOUTH EVERY EVENING   30 tablet   3   . traMADol (ULTRAM) 50 MG tablet      TAKE 2 TABLETS (100 MG TOTAL) BY MOUTH EVERY 6 (SIX) HOURS AS NEEDED FOR PAIN.   240 tablet   2    BP 177/89  Pulse 80  Temp(Src) 98.7 F (37.1 C) (Oral)  Resp 20  SpO2 92% Physical Exam  Nursing note and vitals reviewed. Constitutional: She appears well-developed and well-nourished. No distress.  HENT:  Head: Normocephalic and atraumatic.  Eyes: Conjunctivae are normal. Right eye exhibits no discharge. Left eye exhibits no discharge.  Neck: Neck supple.  Cardiovascular: Normal rate, regular rhythm and normal heart sounds.  Exam reveals no gallop and no friction rub.   No murmur heard. Pulmonary/Chest: Effort normal and breath  sounds normal. No respiratory distress. She exhibits tenderness.  Tenderness to palpation in the area they right anterior axillary line over ribs approximately 4-6. No crepitus. Overlying skin changes. Chest rise symmetric. Breath sounds clear/symmetric.  Abdominal: Soft. She exhibits no distension. There is no tenderness.  Musculoskeletal: She exhibits no edema and no tenderness.  No back tenderness  Neurological: She is alert.  Skin: Skin is warm and dry.  Psychiatric: She has a normal mood and affect. Her behavior is normal. Thought content normal.    ED Course  Procedures (including critical care time) Labs Review Labs Reviewed - No data to display Imaging Review Dg Ribs Unilateral W/chest Right  08/05/2013   CLINICAL DATA:  Pain at lateral right ribs for 5 days. No trauma history submitted.  EXAM: RIGHT RIBS AND CHEST - 3+ VIEW  COMPARISON:  None.  FINDINGS: Frontal view of the chest demonstrates midline trachea. Mild cardiomegaly with atherosclerosis in a tortuous thoracic aorta. A moderate hiatal hernia.  Convex left thoracolumbar spine curvature. Clear lungs. No congestive failure.  Three views of right-sided ribs.  No displaced fracture identified.  IMPRESSION: No displaced rib fracture or pneumothorax.  Cardiomegaly with a moderate hiatal hernia.   Electronically Signed   By: Jeronimo Greaves M.D.   On: 08/05/2013 09:56    EKG Interpretation   None       MDM   1. Right-sided chest wall pain    79 year old female with right-sided chest pain after striking this area and a fall a few days ago. Imaging negative for fracture or pneumothorax. Patient denies any shortness of breath. Plan symptomatic control at this time. Return precautions discussed. Outpatient followup.    Raeford Razor, MD 08/05/13 [phone]

## 2013-09-11 ENCOUNTER — Other Ambulatory Visit: Payer: Self-pay | Admitting: Internal Medicine

## 2013-10-10 ENCOUNTER — Other Ambulatory Visit: Payer: Self-pay | Admitting: Internal Medicine

## 2013-11-30 ENCOUNTER — Other Ambulatory Visit: Payer: Self-pay | Admitting: Internal Medicine

## 2013-11-30 NOTE — Telephone Encounter (Signed)
Done erx 

## 2013-12-27 ENCOUNTER — Other Ambulatory Visit: Payer: Self-pay | Admitting: Internal Medicine

## 2014-01-09 ENCOUNTER — Telehealth: Payer: Self-pay | Admitting: *Deleted

## 2014-01-09 NOTE — Telephone Encounter (Signed)
Please verify daughter understands what independent living is; such that no one will be close to help in event of illness or fall, and no one will be supervising medications on a daily basis  Does she think her mother can do this?

## 2014-01-09 NOTE — Telephone Encounter (Signed)
pts daughter called requesting a letter stating pt can line in an independent senior retirement center.  The facilities contact is [facility] her fax is [phone].  Please advise

## 2014-01-10 ENCOUNTER — Ambulatory Visit (INDEPENDENT_AMBULATORY_CARE_PROVIDER_SITE_OTHER): Payer: Medicare Other | Admitting: Podiatry

## 2014-01-10 ENCOUNTER — Ambulatory Visit: Payer: Self-pay | Admitting: Podiatry

## 2014-01-10 ENCOUNTER — Encounter: Payer: Self-pay | Admitting: Podiatry

## 2014-01-10 ENCOUNTER — Ambulatory Visit (INDEPENDENT_AMBULATORY_CARE_PROVIDER_SITE_OTHER): Payer: Medicare Other

## 2014-01-10 VITALS — BP 120/77 | HR 68 | Resp 18 | Ht 62.0 in | Wt 135.0 lb

## 2014-01-10 DIAGNOSIS — M79609 Pain in unspecified limb: Secondary | ICD-10-CM

## 2014-01-10 DIAGNOSIS — M722 Plantar fascial fibromatosis: Secondary | ICD-10-CM

## 2014-01-10 DIAGNOSIS — B351 Tinea unguium: Secondary | ICD-10-CM

## 2014-01-10 DIAGNOSIS — M2040 Other hammer toe(s) (acquired), unspecified foot: Secondary | ICD-10-CM

## 2014-01-10 MED ORDER — TRAMADOL HCL 50 MG PO TABS: 50.0000 mg | ORAL_TABLET | Freq: Three times a day (TID) | ORAL | Status: DC

## 2014-01-10 NOTE — Progress Notes (Signed)
Subjective:     Patient ID: [name], female   DOB: 16-Apr-1925, 80 y.o.   MRN: [id number]  Foot Pain   patient presents with caregiver stating my feet are hurting all over and I'm not sure what causes it. States that she has trouble with knees and trouble with back also and complains also about her nailbeds being thick and she cannot cut them and they are painful   Review of Systems  All other systems reviewed and are negative.      Objective:   Physical Exam  Nursing note and vitals reviewed. Constitutional: She is oriented to person, place, and time.  Cardiovascular: Intact distal pulses.   Musculoskeletal: Normal range of motion.  Neurological: She is oriented to person, place, and time.  Skin: Skin is dry.   neurovascular status diminished but intact with capillary refill to the digits noted to be 3 seconds bilateral but the toes are warm. Digital deformity of the second toes with elevation and nail disease 1-5 both feet that are painful and brittle. Patient does not have any specific area pain and I was not able to identify any specific area and I did note diminished range of motion subtalar midtarsal joint and diminish muscle strength     Assessment:     Very difficult to make determination of pain but at this time it does not appear to be vascular do to well perfusion of the digits and adequate pulses. Does have nail disease with thickness and discomfort    Plan:

## 2014-01-10 NOTE — Progress Notes (Signed)
   Subjective:    Patient ID: [name], female    DOB: 06-15-1925, 80 y.o.   MRN: [id number]  HPI Comments: Feet just hurt all over , up into the ankles . It s been going on for awhile the left second toe cramp and burn. And the toenails are long and thick and painful , i got some blisters that come up on my toes  Foot Pain      Review of Systems     Objective:   Physical Exam        Assessment & Plan:

## 2014-01-10 NOTE — Telephone Encounter (Signed)
Left message on VM to return call to [facility] 

## 2014-01-11 NOTE — Telephone Encounter (Signed)
Dawn notified that letter had been faxed to number provided. Dawn also requested copy of letter be sent to pt's home address.

## 2014-01-11 NOTE — Telephone Encounter (Signed)
Letter done for pt

## 2014-01-11 NOTE — Telephone Encounter (Signed)
Pt is totally independent in the home as her daughter has passed away but pt stillresides in the home for now.  That's why her daughter Jackelyn Poling is wanting to place pt in retirement center.

## 2014-01-11 NOTE — Telephone Encounter (Signed)
Does the patient take care of all her own needs during the day , or does she need any help from the sister such as taking meds, bathing, cooking, and other ADl's?

## 2014-01-11 NOTE — Telephone Encounter (Signed)
Spoke with pts daughter advised of MDs message.  She states she understands and her mother is currently living independently at her sisters home.  Please advise

## 2014-02-08 ENCOUNTER — Telehealth: Payer: Self-pay | Admitting: *Deleted

## 2014-02-08 NOTE — Telephone Encounter (Signed)
'  as long as pain is not worse she does not need to be seen

## 2014-02-08 NOTE — Telephone Encounter (Signed)
I attempted to call the patient to inform her if pain gets worse please call and schedule an appointment with Dr. Paulla Dolly.

## 2014-02-08 NOTE — Telephone Encounter (Signed)
Dr. Paulla Dolly wanted me to call and tell him how I'm doing.  I'm still having pain in my toes, runs up into my ankle.  I still have burning and throbbing.  I offered to schedule her an appointment for follow-up.  She stated she just came in recently.  I told her I would let Dr. Paulla Dolly know.

## 2014-03-26 ENCOUNTER — Other Ambulatory Visit: Payer: Self-pay | Admitting: Internal Medicine

## 2014-05-02 ENCOUNTER — Ambulatory Visit: Payer: Medicare Other | Admitting: Internal Medicine

## 2014-05-08 ENCOUNTER — Other Ambulatory Visit (INDEPENDENT_AMBULATORY_CARE_PROVIDER_SITE_OTHER): Payer: Medicare Other

## 2014-05-08 ENCOUNTER — Encounter: Payer: Self-pay | Admitting: Internal Medicine

## 2014-05-08 ENCOUNTER — Ambulatory Visit (INDEPENDENT_AMBULATORY_CARE_PROVIDER_SITE_OTHER): Payer: Medicare Other | Admitting: Internal Medicine

## 2014-05-08 VITALS — BP 156/100 | HR 86 | Temp 98.2°F | Wt 135.8 lb

## 2014-05-08 DIAGNOSIS — G8929 Other chronic pain: Secondary | ICD-10-CM

## 2014-05-08 DIAGNOSIS — Z23 Encounter for immunization: Secondary | ICD-10-CM

## 2014-05-08 DIAGNOSIS — R7302 Impaired glucose tolerance (oral): Secondary | ICD-10-CM

## 2014-05-08 DIAGNOSIS — R7309 Other abnormal glucose: Secondary | ICD-10-CM

## 2014-05-08 DIAGNOSIS — E785 Hyperlipidemia, unspecified: Secondary | ICD-10-CM

## 2014-05-08 DIAGNOSIS — C9000 Multiple myeloma not having achieved remission: Secondary | ICD-10-CM

## 2014-05-08 DIAGNOSIS — R030 Elevated blood-pressure reading, without diagnosis of hypertension: Secondary | ICD-10-CM | POA: Insufficient documentation

## 2014-05-08 DIAGNOSIS — D8989 Other specified disorders involving the immune mechanism, not elsewhere classified: Secondary | ICD-10-CM

## 2014-05-08 DIAGNOSIS — E538 Deficiency of other specified B group vitamins: Secondary | ICD-10-CM

## 2014-05-08 DIAGNOSIS — F411 Generalized anxiety disorder: Secondary | ICD-10-CM

## 2014-05-08 HISTORY — DX: Deficiency of other specified B group vitamins: E53.8

## 2014-05-08 LAB — BASIC METABOLIC PANEL
BUN: 15 mg/dL (ref 6–23)
CHLORIDE: 111 meq/L (ref 96–112)
CO2: 26 mEq/L (ref 19–32)
CREATININE: 1.2 mg/dL (ref 0.4–1.2)
Calcium: 9.3 mg/dL (ref 8.4–10.5)
GFR: 43.91 mL/min — ABNORMAL LOW (ref >60.00)
Glucose, Bld: 83 mg/dL (ref 70–99)
Potassium: 4.4 mEq/L (ref 3.5–5.1)
Sodium: 144 mEq/L (ref 135–145)

## 2014-05-08 LAB — CBC WITH DIFFERENTIAL/PLATELET
BASOS ABS: 0 10*3/uL (ref 0.0–0.1)
BASOS PCT: 0.5 % (ref 0.0–3.0)
EOS ABS: 0.4 10*3/uL (ref 0.0–0.7)
Eosinophils Relative: 4.2 % (ref 0.0–5.0)
HCT: 43.3 % (ref 36.0–46.0)
Hemoglobin: 14.1 g/dL (ref 12.0–15.0)
LYMPHS PCT: 8.4 % — AB (ref 12.0–46.0)
Lymphs Abs: 0.8 10*3/uL (ref 0.7–4.0)
MCHC: 32.7 g/dL (ref 30.0–36.0)
MCV: 90 fl (ref 78.0–100.0)
Monocytes Absolute: 1 10*3/uL (ref 0.1–1.0)
Monocytes Relative: 11.1 % (ref 3.0–12.0)
NEUTROS PCT: 75.8 % (ref 43.0–77.0)
Neutro Abs: 6.9 10*3/uL (ref 1.4–7.7)
PLATELETS: 190 10*3/uL (ref 150.0–400.0)
RBC: 4.8 Mil/uL (ref 3.87–5.11)
RDW: 15.8 % — AB (ref 11.5–15.5)
WBC: 9.1 10*3/uL (ref 4.0–10.5)

## 2014-05-08 LAB — URINALYSIS, ROUTINE W REFLEX MICROSCOPIC
Bilirubin Urine: NEGATIVE
Hgb urine dipstick: NEGATIVE
Ketones, ur: NEGATIVE
LEUKOCYTES UA: NEGATIVE
NITRITE: NEGATIVE
Specific Gravity, Urine: <=1.005 — AB (ref 1.000–1.030)
Total Protein, Urine: NEGATIVE
Urine Glucose: NEGATIVE
Urobilinogen, UA: 0.2 (ref 0.0–1.0)
pH: 5.5 (ref 5.0–8.0)

## 2014-05-08 LAB — HEPATIC FUNCTION PANEL
ALT: 21 U/L (ref 0–35)
AST: 18 U/L (ref 0–37)
Albumin: 3.5 g/dL (ref 3.5–5.2)
Alkaline Phosphatase: 66 U/L (ref 39–117)
BILIRUBIN DIRECT: 0.1 mg/dL (ref 0.0–0.3)
TOTAL PROTEIN: 6.8 g/dL (ref 6.0–8.3)
Total Bilirubin: 0.5 mg/dL (ref 0.2–1.2)

## 2014-05-08 LAB — LIPID PANEL
CHOLESTEROL: 204 mg/dL — AB (ref 0–200)
HDL: 37.9 mg/dL — ABNORMAL LOW (ref >39.00)
NonHDL: 166.1
TRIGLYCERIDES: 238 mg/dL — AB (ref 0.0–149.0)
Total CHOL/HDL Ratio: 5
VLDL: 47.6 mg/dL — ABNORMAL HIGH (ref 0.0–40.0)

## 2014-05-08 LAB — TSH: TSH: 1.01 u[IU]/mL (ref 0.35–4.50)

## 2014-05-08 LAB — LDL CHOLESTEROL, DIRECT: Direct LDL: 151.7 mg/dL

## 2014-05-08 LAB — HEMOGLOBIN A1C: Hgb A1c MFr Bld: 5.8 % (ref 4.6–6.5)

## 2014-05-08 MED ORDER — OMEPRAZOLE 20 MG PO CPDR: DELAYED_RELEASE_CAPSULE | ORAL | Status: DC

## 2014-05-08 MED ORDER — GABAPENTIN 300 MG PO CAPS: 300.0000 mg | ORAL_CAPSULE | Freq: Three times a day (TID) | ORAL | Status: DC

## 2014-05-08 MED ORDER — CLONAZEPAM 0.5 MG PO TABS: 0.5000 mg | ORAL_TABLET | Freq: Two times a day (BID) | ORAL | Status: DC | PRN

## 2014-05-08 MED ORDER — SERTRALINE HCL 50 MG PO TABS: 50.0000 mg | ORAL_TABLET | Freq: Every day | ORAL | Status: DC

## 2014-05-08 NOTE — Patient Instructions (Addendum)
You had the new Prevnar pneumonia shot, and the Flu shot  Please take all new medication as prescribed - the clonazepam twice per day as needed  OK to stop the lorazepam  Please continue all other medications as before, and refills have been done if requested - the prilosec, and gabapentin  Please see Dr Caprice Beaver for the zoloft  You will be contacted regarding the referral for: Hematology/oncology for the "high protein" in the blood  Please have the pharmacy call with any other refills you may need.  Please continue your efforts at being more active, low cholesterol diet, and weight control.  You are otherwise up to date with prevention measures today.  Please keep your appointments with your specialists as you may have planned- Dr Caprice Beaver  Please check your Blood Pressure daily for the next week, and call if most of the measurements are more than 140/90  Please go to the LAB in the Basement (turn left off the elevator) for the tests to be done today  You will be contacted by phone if any changes need to be made immediately.  Otherwise, you will receive a letter about your results with an explanation, but please check with MyChart first.  Please remember to sign up for MyChart if you have not done so, as this will be important to you in the future with finding out test results, communicating by private email, and scheduling acute appointments online when needed.  Please return in 6 months, or sooner if needed

## 2014-05-08 NOTE — Progress Notes (Signed)
Pre visit review using our clinic review tool, if applicable. No additional management support is needed unless otherwise documented below in the visit note. 

## 2014-05-08 NOTE — Progress Notes (Signed)
Subjective:    Patient ID: [name], female    DOB: [birth year], 80 y.o.   MRN: [id number]    HPI  Here with daughter after lost to f/u x 2 yrs; has been seeing psychiatry, never did see heme/onc after elev Kappa light chain found aug 2013.  Pt with mult nonspecific complaints as in the past  -  feels drowsy, sleepy, tired, exhausted all the time, long hx of chronic depression followed per Dr Caprice Beaver.  Pt denies chest pain, increased sob or doe, wheezing, orthopnea, PND, increased LE swelling, palpitations, dizziness or syncope.  Pt denies new neurological symptoms such as new headache, or facial or extremity weakness, but does have ongoing bilat feet numbness and recurring left sciatica (o/w no change in freq or severity) managed with the gabapentin.  No longer on cymbalta, changed to zoloft per psych since last seen.  Asks for prn benzo as in the past, has tolerated well.  Is taking otc B12 supplement daily, after finding of low b12 at last visit.  Past Medical History  Diagnosis Date  . ALLERGIC RHINITIS   . ANEMIA-NOS   . ANXIETY   . CERVICAL CANCER, HX OF   . DEGENERATIVE DISC DISEASE, LUMBOSACRAL SPINE W/RADICULOPATHY   . DEGENERATIVE JOINT DISEASE, ANKLE   . DEPRESSION   . GASTROENTERITIS/COLITIS D/T RADIATION   . GERD   . GLUCOSE INTOLERANCE   . IBS   . MALNUTRITION   . MENOPAUSAL DISORDER   . OSTEOPOROSIS   . Peripheral neuropathy 12/02/2011  . Hyperlipidemia 05/18/2012  . B12 deficiency 05/08/2014   Past Surgical History  Procedure Laterality Date  . Abdominal hysterectomy    . (l) hip fracture    . S/p sb resection and stricturoplasty  09/2000  . Appendectomy    . Oophorectomy      reports that she has never smoked. She does not have any smokeless tobacco history on file. She reports that she does not drink alcohol or use illicit drugs. family history includes Lung cancer in her daughter. No Known Allergies Current Outpatient Prescriptions on File Prior to Visit    Medication Sig Dispense Refill  . meclizine (ANTIVERT) 25 MG tablet TAKE 1 TABLET BY MOUTH 3 TIMES A DAY AS NEEDED  30 tablet  1   No current facility-administered medications on file prior to visit.   Review of Systems  Constitutional: Negative for unusual diaphoresis or other sweats  HENT: Negative for ringing in ear Eyes: Negative for double vision or worsening visual disturbance.  Respiratory: Negative for choking and stridor.   Gastrointestinal: Negative for vomiting or other signifcant bowel change Genitourinary: Negative for hematuria or decreased urine volume.  Musculoskeletal: Negative for other MSK pain or swelling Skin: Negative for color change and worsening wound.  Neurological: Negative for tremors and numbness other than noted  Psychiatric/Behavioral: Negative for decreased concentration or agitation other than above       Objective:   Physical Exam BP 156/100  Pulse 86  Temp(Src) 98.2 F (36.8 C) (Oral)  Wt 135 lb 12 oz (61.576 kg)  SpO2 95% VS noted,  Constitutional: Pt appears normal wt , fatigued HENT: Head: NCAT.  Right Ear: External ear normal.  Left Ear: External ear normal.  Eyes: . Pupils are equal, round, and reactive to light. Conjunctivae and EOM are normal Neck: Normal range of motion. Neck supple.  Cardiovascular: Normal rate and regular rhythm.   Pulmonary/Chest: Effort normal and breath sounds normal.  Abd:  Soft, NT,  ND, + BS Neurological: Pt is alert. Not confused , motor grossly intact Skin: Skin is warm. No rash Psychiatric: Pt behavior is normal. No agitation. chronic dysphoric appaering, ? Psychomotor slowed    Assessment & Plan:

## 2014-05-09 ENCOUNTER — Telehealth: Payer: Self-pay | Admitting: Hematology and Oncology

## 2014-05-09 NOTE — Telephone Encounter (Signed)
S/W PATIENT AND GAVE NP APPT FOR 08/31 @ 10 W/DR. [place].

## 2014-05-13 NOTE — Assessment & Plan Note (Signed)
stable overall by history and exam, recent data reviewed with pt, and pt to continue medical treatment as before,  to f/u any worsening symptoms or concerns Lab Results  Component Value Date   LDLCALC 92 05/18/2012   For f/u lab, lower chol diet

## 2014-05-13 NOTE — Assessment & Plan Note (Addendum)
For re-check labs, refer heme  Note:  Total time for pt hx, exam, review of record with pt in the room, determination of diagnoses and plan for further eval and tx is > 40 min, with over 50% spent in coordination and counseling of patient

## 2014-05-13 NOTE — Assessment & Plan Note (Signed)
Cont same tx, for b12 f/u lab

## 2014-05-13 NOTE — Assessment & Plan Note (Signed)
For gabapentin refill,  to f/u any worsening symptoms or concerns

## 2014-05-13 NOTE — Assessment & Plan Note (Signed)
For change to klonopin for hopefully better efficacy,  to f/u any worsening symptoms or concerns

## 2014-05-13 NOTE — Assessment & Plan Note (Signed)
Asympt, stable overall by history and exam, recent data reviewed with pt, and pt to continue medical treatment as before,  to f/u any worsening symptoms or concerns Lab Results  Component Value Date   HGBA1C 5.8 05/08/2014   For f/u a1c

## 2014-05-13 NOTE — Assessment & Plan Note (Signed)
BP Readings from Last 3 Encounters:  05/08/14 156/100  01/10/14 120/77  08/05/13 157/81   Pt declines change in med for now as states BP at home < 140/90, ok to cont monitor at home as has not done this very recently however, such as qod for 2 wks, call for > 140/90

## 2014-05-15 ENCOUNTER — Telehealth: Payer: Self-pay | Admitting: Internal Medicine

## 2014-05-15 NOTE — Telephone Encounter (Signed)
Recent UA was OK  Ok to stop the clonazepam due to side effects  We could consider other medication if needed such as ativan  I'm not sure what to say about the leg pain - consider OV

## 2014-05-15 NOTE — Telephone Encounter (Signed)
The patient has been wetting at night.  Also stated the clonazepam is causing her to be dizzy and sleepy.  She is also having leg/feet pain, advise on what to take

## 2014-05-15 NOTE — Telephone Encounter (Signed)
Pt called requesting to speak to you about her urine and medication.  I was unable to obtain anymore details than this from the pt.

## 2014-05-16 MED ORDER — LORAZEPAM 0.5 MG PO TABS: 0.5000 mg | ORAL_TABLET | Freq: Two times a day (BID) | ORAL | Status: DC | PRN

## 2014-05-16 NOTE — Telephone Encounter (Signed)
Patient informed and faxed hardcopy to [facility].

## 2014-05-16 NOTE — Telephone Encounter (Signed)
Called the patient informed of response to her questions.  The patient does want to try Ativan.

## 2014-05-20 ENCOUNTER — Encounter: Payer: Self-pay | Admitting: Hematology and Oncology

## 2014-05-20 ENCOUNTER — Telehealth: Payer: Self-pay | Admitting: Hematology and Oncology

## 2014-05-20 ENCOUNTER — Other Ambulatory Visit: Payer: Self-pay | Admitting: Hematology and Oncology

## 2014-05-20 ENCOUNTER — Ambulatory Visit: Payer: Medicare Other

## 2014-05-20 ENCOUNTER — Ambulatory Visit (HOSPITAL_BASED_OUTPATIENT_CLINIC_OR_DEPARTMENT_OTHER): Payer: Medicare Other | Admitting: Hematology and Oncology

## 2014-05-20 VITALS — BP 178/88 | HR 65 | Temp 98.5°F | Resp 18 | Ht 62.0 in | Wt 136.6 lb

## 2014-05-20 DIAGNOSIS — R230 Cyanosis: Secondary | ICD-10-CM | POA: Insufficient documentation

## 2014-05-20 DIAGNOSIS — E538 Deficiency of other specified B group vitamins: Secondary | ICD-10-CM

## 2014-05-20 DIAGNOSIS — R799 Abnormal finding of blood chemistry, unspecified: Secondary | ICD-10-CM

## 2014-05-20 DIAGNOSIS — R778 Other specified abnormalities of plasma proteins: Secondary | ICD-10-CM

## 2014-05-20 HISTORY — DX: Other specified abnormalities of plasma proteins: R77.8

## 2014-05-20 NOTE — Assessment & Plan Note (Signed)
She has severe neuropathy, could be related to vitamin B12 deficiency. We'll recheck a serum vitamin B12 at the next visit.

## 2014-05-20 NOTE — Telephone Encounter (Signed)
gv and pirnted appt sched and avs for pt for Sept....pt pick up labs

## 2014-05-20 NOTE — Progress Notes (Signed)
Checked in new patient with no financial issues prior to seeing the dr. She has her appt card and I advised if any asst needed to contact me. [place] been [place] of the [place]

## 2014-05-20 NOTE — Telephone Encounter (Signed)
gv and printed appt sched adn avs for pt for Sept °

## 2014-05-20 NOTE — Progress Notes (Signed)
[place] NOTE  Patient Care Team: Biagio Borg, MD as PCP - General  CHIEF COMPLAINTS/PURPOSE OF CONSULTATION:  Elevator kappa light chain  HISTORY OF PRESENTING ILLNESS:  [name] 80 y.o. female is here because of abnormal global. This patient had severe joint pain throughout and severe ischemic pains especially in her toes. She has abnormal changes in lower extremities. She had abnormal serum protein electrophoresis done several years ago. She denies history of abnormal bone fracture. Patient denies history of recurrent infection or atypical infections such as shingles of meningitis. Denies chills, night sweats, anorexia or abnormal weight loss.  MEDICAL HISTORY:  Past Medical History  Diagnosis Date  . ALLERGIC RHINITIS   . ANEMIA-NOS   . ANXIETY   . CERVICAL CANCER, HX OF   . DEGENERATIVE DISC DISEASE, LUMBOSACRAL SPINE W/RADICULOPATHY   . DEGENERATIVE JOINT DISEASE, ANKLE   . DEPRESSION   . GASTROENTERITIS/COLITIS D/T RADIATION   . GERD   . GLUCOSE INTOLERANCE   . IBS   . MALNUTRITION   . MENOPAUSAL DISORDER   . OSTEOPOROSIS   . Peripheral neuropathy 12/02/2011  . Hyperlipidemia 05/18/2012  . B12 deficiency 05/08/2014  . Cervical cancer   . Abnormal serum protein electrophoresis 05/20/2014    SURGICAL HISTORY: Past Surgical History  Procedure Laterality Date  . Abdominal hysterectomy    . (l) hip fracture    . S/p sb resection and stricturoplasty  09/2000  . Appendectomy    . Oophorectomy      SOCIAL HISTORY: History   Social History  . Marital Status: Widowed    Spouse Name: N/A    Number of Children: N/A  . Years of Education: N/A   Occupational History  . Not on file.   Social History Main Topics  . Smoking status: Never Smoker   . Smokeless tobacco: Never Used  . Alcohol Use: No  . Drug Use: No  . Sexual Activity: Not on file   Other Topics Concern  . Not on file   Social History Narrative  . No narrative on  file    FAMILY HISTORY: Family History  Problem Relation Age of Onset  . Lung cancer Daughter     ALLERGIES:  has No Known Allergies.  MEDICATIONS:  Current Outpatient Prescriptions  Medication Sig Dispense Refill  . clonazePAM (KLONOPIN) 0.5 MG tablet       . Doxylamine-PSE-APAP (TYLENOL SINUS NIGHT TIME PO) Take by mouth.      . DULoxetine (CYMBALTA) 30 MG capsule       . gabapentin (NEURONTIN) 300 MG capsule Take 1-2 capsules (300-600 mg total) by mouth 3 (three) times daily. Take 1  Capsule twice a day and 2 capsules at bedtime  360 capsule  1  . LORazepam (ATIVAN) 0.5 MG tablet Take 1 tablet (0.5 mg total) by mouth 2 (two) times daily as needed for anxiety.  60 tablet  1  . meclizine (ANTIVERT) 25 MG tablet TAKE 1 TABLET BY MOUTH 3 TIMES A DAY AS NEEDED  30 tablet  1  . omeprazole (PRILOSEC) 20 MG capsule TAKE 2 CAPSULES BY MOUTH EVERY DAY  180 capsule  3  . sertraline (ZOLOFT) 50 MG tablet Take 1 tablet (50 mg total) by mouth daily.  30 tablet  11  . traMADol (ULTRAM) 50 MG tablet        No current facility-administered medications for this visit.    REVIEW OF SYSTEMS:   Eyes: Denies blurriness of vision, double  vision or watery eyes Ears, nose, mouth, throat, and face: Denies mucositis or sore throat Respiratory: Denies cough, dyspnea or wheezes Cardiovascular: Denies palpitation, chest discomfort or lower extremity swelling Gastrointestinal:  Denies nausea, heartburn or change in bowel habits Skin: Denies abnormal skin rashes Lymphatics: Denies new lymphadenopathy or easy bruising Neurological:Denies numbness, tingling or new weaknesses Behavioral/Psych: Mood is stable, no new changes  All other systems were reviewed with the patient and are negative.  PHYSICAL EXAMINATION: ECOG PERFORMANCE STATUS: 1 - Symptomatic but completely ambulatory  Filed Vitals:   05/20/14 1019  BP: 178/88  Pulse: 65  Temp: 98.5 F (36.9 C)  Resp: 18   Filed Weights   05/20/14 1019   Weight: 136 lb 9.6 oz (61.961 kg)    GENERAL:alert, no distress and comfortable. She is elderly with poor hearing SKIN: Noted dry skin and discolored digits in her extremities. No petechiae rash EYES: normal, conjunctiva are pink and non-injected, sclera clear OROPHARYNX:no exudate, no erythema and lips, buccal mucosa, and tongue normal  NECK: supple, thyroid normal size, non-tender, without nodularity LYMPH:  no palpable lymphadenopathy in the cervical, axillary or inguinal LUNGS: clear to auscultation and percussion with normal breathing effort HEART: regular rate & rhythm soft systolic murmurs and no lower extremity edema ABDOMEN:abdomen soft, non-tender and normal bowel sounds Musculoskeletal: Significant peripheral cyanosis in her feet without clubbing  PSYCH: alert & oriented x 3 with fluent speech NEURO: no focal motor/sensory deficits  LABORATORY DATA:  I have reviewed the data as listed Lab Results  Component Value Date   WBC 9.1 05/08/2014   HGB 14.1 05/08/2014   HCT 43.3 05/08/2014   MCV 90.0 05/08/2014   PLT 190.0 05/08/2014   ASSESSMENT & PLAN:  Abnormal serum protein electrophoresis She was noted to have elevated kappa light chain in the past. She also has severe chronic joint pain throughout. We discussed about the approach to exclude diagnosis with multiple myeloma including blood work, 24-hour urine collection and skeletal survey. She agreed to proceed.  B12 deficiency She has severe neuropathy, could be related to vitamin B12 deficiency. We'll recheck a serum vitamin B12 at the next visit.  Peripheral cyanosis She has severe pain in her toes and feet with associated changes of peripheral cyanosis suggestive of poor circulation. I will order additional workup to exclude cryoglobulinemia. I recommend she takes aspirin with food daily to improve her peripheral circulation.    Orders Placed This Encounter  Procedures  . DG Bone Survey Met    Standing Status:  Future     Number of Occurrences:      Standing Expiration Date: 07/20/2015    Order Specific Question:  Reason for Exam (SYMPTOM  OR DIAGNOSIS REQUIRED)    Answer:  staging myeloma    Order Specific Question:  Preferred imaging location?    Answer:  [facility]  . CBC with Differential    Standing Status: Future     Number of Occurrences:      Standing Expiration Date: 07/20/2015  . Comprehensive metabolic panel    Standing Status: Future     Number of Occurrences:      Standing Expiration Date: 07/20/2015  . Lactate dehydrogenase    Standing Status: Future     Number of Occurrences:      Standing Expiration Date: 07/20/2015  . Vitamin B12    Standing Status: Future     Number of Occurrences:      Standing Expiration Date: 07/20/2015  . Sedimentation rate  Standing Status: Future     Number of Occurrences:      Standing Expiration Date: 07/20/2015  . ANA w/Reflex if Positive    Standing Status: Future     Number of Occurrences:      Standing Expiration Date: 07/20/2015  . IFE, Urine (with Tot Prot)    Standing Status: Future     Number of Occurrences:      Standing Expiration Date: 07/20/2015  . Beta 2 microglobulin, serum    Standing Status: Future     Number of Occurrences:      Standing Expiration Date: 07/20/2015  . Kappa/lambda light chains    Standing Status: Future     Number of Occurrences:      Standing Expiration Date: 07/20/2015  . Protein Electro, 24-Hour Urine    Standing Status: Future     Number of Occurrences:      Standing Expiration Date: 07/20/2015  . SPEP & IFE with QIG    Standing Status: Future     Number of Occurrences:      Standing Expiration Date: 07/20/2015  . Rheumatoid factor    Standing Status: Future     Number of Occurrences:      Standing Expiration Date: 07/20/2015  . Cryoglobulin    Standing Status: Future     Number of Occurrences:      Standing Expiration Date: 07/20/2015    All questions were answered. The  patient knows to call the clinic with any problems, questions or concerns. I spent 40 minutes counseling the patient face to face. The total time spent in the appointment was 55 minutes and more than 50% was on counseling.     [facility], [place], MD 05/20/2014 8:11 PM

## 2014-05-20 NOTE — Assessment & Plan Note (Signed)
She has severe pain in her toes and feet with associated changes of peripheral cyanosis suggestive of poor circulation. I will order additional workup to exclude cryoglobulinemia. I recommend she takes aspirin with food daily to improve her peripheral circulation.

## 2014-05-20 NOTE — Assessment & Plan Note (Signed)
She was noted to have elevated kappa light chain in the past. She also has severe chronic joint pain throughout. We discussed about the approach to exclude diagnosis with multiple myeloma including blood work, 24-hour urine collection and skeletal survey. She agreed to proceed.

## 2014-05-22 ENCOUNTER — Ambulatory Visit (HOSPITAL_COMMUNITY)
Admission: RE | Admit: 2014-05-22 | Discharge: 2014-05-22 | Disposition: A | Discharge status: Home / Self Care | Payer: Medicare Other | Source: Ambulatory Visit | Attending: Hematology and Oncology | Admitting: Hematology and Oncology

## 2014-05-22 ENCOUNTER — Ambulatory Visit (HOSPITAL_BASED_OUTPATIENT_CLINIC_OR_DEPARTMENT_OTHER): Payer: Medicare Other

## 2014-05-22 DIAGNOSIS — E538 Deficiency of other specified B group vitamins: Secondary | ICD-10-CM

## 2014-05-22 DIAGNOSIS — R799 Abnormal finding of blood chemistry, unspecified: Secondary | ICD-10-CM

## 2014-05-22 DIAGNOSIS — M549 Dorsalgia, unspecified: Secondary | ICD-10-CM | POA: Diagnosis not present

## 2014-05-22 DIAGNOSIS — R778 Other specified abnormalities of plasma proteins: Secondary | ICD-10-CM

## 2014-05-22 LAB — CBC WITH DIFFERENTIAL/PLATELET
BASO%: 0.8 % (ref 0.0–2.0)
Basophils Absolute: 0.1 10*3/uL (ref 0.0–0.1)
EOS ABS: 0.5 10*3/uL (ref 0.0–0.5)
EOS%: 5.5 % (ref 0.0–7.0)
HCT: 43.3 % (ref 34.8–46.6)
HGB: 13.6 g/dL (ref 11.6–15.9)
LYMPH#: 0.7 10*3/uL — AB (ref 0.9–3.3)
LYMPH%: 8.6 % — ABNORMAL LOW (ref 14.0–49.7)
MCH: 28.6 pg (ref 25.1–34.0)
MCHC: 31.4 g/dL — ABNORMAL LOW (ref 31.5–36.0)
MCV: 91 fL (ref 79.5–101.0)
MONO#: 0.9 10*3/uL (ref 0.1–0.9)
MONO%: 11.3 % (ref 0.0–14.0)
NEUT#: 6.1 10*3/uL (ref 1.5–6.5)
NEUT%: 73.8 % (ref 38.4–76.8)
Platelets: 179 10*3/uL (ref 145–400)
RBC: 4.76 10*6/uL (ref 3.70–5.45)
RDW: 14.9 % — AB (ref 11.2–14.5)
WBC: 8.2 10*3/uL (ref 3.9–10.3)

## 2014-05-22 LAB — COMPREHENSIVE METABOLIC PANEL (CC13)
ALBUMIN: 3.3 g/dL — AB (ref 3.5–5.0)
ALT: 11 U/L (ref 0–55)
AST: 12 U/L (ref 5–34)
Alkaline Phosphatase: 67 U/L (ref 40–150)
Anion Gap: 9 mEq/L (ref 3–11)
BUN: 17.3 mg/dL (ref 7.0–26.0)
CO2: 21 mEq/L — ABNORMAL LOW (ref 22–29)
Calcium: 9 mg/dL (ref 8.4–10.4)
Chloride: 112 mEq/L — ABNORMAL HIGH (ref 98–109)
Creatinine: 1.3 mg/dL — ABNORMAL HIGH (ref 0.6–1.1)
GLUCOSE: 80 mg/dL (ref 70–140)
POTASSIUM: 4.1 meq/L (ref 3.5–5.1)
SODIUM: 142 meq/L (ref 136–145)
TOTAL PROTEIN: 6.4 g/dL (ref 6.4–8.3)
Total Bilirubin: 0.33 mg/dL (ref 0.20–1.20)

## 2014-05-22 LAB — LACTATE DEHYDROGENASE (CC13): LDH: 184 U/L (ref 125–245)

## 2014-05-23 ENCOUNTER — Encounter: Payer: Self-pay | Admitting: *Deleted

## 2014-05-23 NOTE — Progress Notes (Signed)
[name]  Clinical Social [name] was referred by patient's son in [place], [place] to review and complete healthcare advance directives.  Clinical Social Worker met with patient and patient's daughter [name] in [place] office.  The patient designated daughter [name] as their primary healthcare agent and sister [name] as their secondary agent.  Patient also completed healthcare living will.    Clinical Social Worker notarized documents and made copies for patient/family. Clinical Social Worker will send documents to medical records to be scanned into patient's chart. Clinical Social Worker encouraged patient/family to contact with any additional questions or concerns.  [name], MSW, [place] Worker [facility] [phone]

## 2014-05-24 LAB — UPEP/TP, 24-HR URINE
Collection Interval: 24 hours
TOTAL PROTEIN, URINE/DAY: 81 mg/d (ref 50–100)
Total Protein, Urine: 9 mg/dL
Total Volume, Urine: 900 mL

## 2014-05-24 LAB — UIFE/LIGHT CHAINS/TP QN, 24-HR UR
ALBUMIN, U: DETECTED
ALPHA 2 UR: DETECTED — AB
Alpha 1, Urine: DETECTED — AB
Beta, Urine: DETECTED — AB
GAMMA UR: DETECTED — AB
Time: 24 hours
Total Protein, Urine-Ur/day: 81 mg/d (ref <150)
Total Protein, Urine: 9 mg/dL (ref 5–24)
Volume, Urine: 900 mL

## 2014-05-26 LAB — SPEP & IFE WITH QIG
ALBUMIN ELP: 54.8 % — AB (ref 55.8–66.1)
Alpha-1-Globulin: 8.3 % — ABNORMAL HIGH (ref 2.9–4.9)
Alpha-2-Globulin: 13.6 % — ABNORMAL HIGH (ref 7.1–11.8)
Beta 2: 6.2 % (ref 3.2–6.5)
Beta Globulin: 8 % — ABNORMAL HIGH (ref 4.7–7.2)
GAMMA GLOBULIN: 9.1 % — AB (ref 11.1–18.8)
IGM, SERUM: 33 mg/dL — AB (ref 52–322)
IgA: 388 mg/dL — ABNORMAL HIGH (ref 69–380)
IgG (Immunoglobin G), Serum: 497 mg/dL — ABNORMAL LOW (ref 690–1700)
Total Protein, Serum Electrophoresis: 6.1 g/dL (ref 6.0–8.3)

## 2014-05-26 LAB — SEDIMENTATION RATE: Sed Rate: 4 mm/hr (ref 0–22)

## 2014-05-26 LAB — RHEUMATOID FACTOR: Rhuematoid fact SerPl-aCnc: <10 IU/mL (ref <=14)

## 2014-05-26 LAB — KAPPA/LAMBDA LIGHT CHAINS
KAPPA FREE LGHT CHN: 3.6 mg/dL — AB (ref 0.33–1.94)
Kappa:Lambda Ratio: 1.35 (ref 0.26–1.65)
Lambda Free Lght Chn: 2.67 mg/dL — ABNORMAL HIGH (ref 0.57–2.63)

## 2014-05-26 LAB — VITAMIN B12: Vitamin B-12: 1657 pg/mL — ABNORMAL HIGH (ref 211–911)

## 2014-05-26 LAB — BETA 2 MICROGLOBULIN, SERUM: BETA 2 MICROGLOBULIN: 4.39 mg/L — AB (ref <=2.51)

## 2014-05-26 LAB — ANTI-NUCLEAR AB-TITER (ANA TITER): ANA Titer 1: 1:40 {titer} — ABNORMAL HIGH

## 2014-05-26 LAB — CRYOGLOBULIN

## 2014-05-26 LAB — ANA: Anti Nuclear Antibody(ANA): POSITIVE — AB

## 2014-06-03 ENCOUNTER — Other Ambulatory Visit: Payer: Self-pay | Admitting: Hematology and Oncology

## 2014-06-03 ENCOUNTER — Encounter: Payer: Self-pay | Admitting: Hematology and Oncology

## 2014-06-03 ENCOUNTER — Ambulatory Visit (HOSPITAL_BASED_OUTPATIENT_CLINIC_OR_DEPARTMENT_OTHER): Payer: Medicare Other | Admitting: Hematology and Oncology

## 2014-06-03 ENCOUNTER — Telehealth: Payer: Self-pay | Admitting: Hematology and Oncology

## 2014-06-03 VITALS — BP 163/80 | HR 66 | Temp 97.5°F | Resp 17 | Ht 62.0 in | Wt 136.3 lb

## 2014-06-03 DIAGNOSIS — R778 Other specified abnormalities of plasma proteins: Secondary | ICD-10-CM

## 2014-06-03 DIAGNOSIS — R894 Abnormal immunological findings in specimens from other organs, systems and tissues: Secondary | ICD-10-CM

## 2014-06-03 DIAGNOSIS — R799 Abnormal finding of blood chemistry, unspecified: Secondary | ICD-10-CM

## 2014-06-03 DIAGNOSIS — R768 Other specified abnormal immunological findings in serum: Secondary | ICD-10-CM | POA: Insufficient documentation

## 2014-06-03 HISTORY — DX: Other specified abnormal immunological findings in serum: R76.8

## 2014-06-03 NOTE — Telephone Encounter (Signed)
Faxed pt medical records to [facility]

## 2014-06-03 NOTE — Assessment & Plan Note (Signed)
I reassured the patient and family. They abnormal serum protein electrophoresis detected elsewhere was really polyclonal, likely related to undiagnosed autoimmune condition. Serum protein electrophoresis with immunofixation and 24-hour urine collection along with skeletal survey did not show clonal disease. She does not need to return.

## 2014-06-03 NOTE — Telephone Encounter (Signed)
lvm for [facility] to sched pt with Dr. Trudie Reed...emailed [facility] to fax records over

## 2014-06-03 NOTE — Assessment & Plan Note (Signed)
She has elevated ANA, along with signs of arthritis and peripheral cyanosis, suggestive of connective tissue disease/possible component of vasculitis. I recommend referral to rheumatologist and she agreed.

## 2014-06-03 NOTE — Progress Notes (Signed)
[place] OFFICE PROGRESS NOTE  Cathlean Cower, MD SUMMARY OF HEMATOLOGIC HISTORY: This patient had severe joint pain throughout and severe ischemic pains especially in her toes. She has abnormal changes in lower extremities. She had abnormal serum protein electrophoresis done several years ago. She denies history of abnormal bone fracture. INTERVAL HISTORY: [name] 80 y.o. female returns for further followup. She has no new concerns.  I have reviewed the past medical history, past surgical history, social history and family history with the patient and they are unchanged from previous note.  ALLERGIES:  has No Known Allergies.  MEDICATIONS:  Current Outpatient Prescriptions  Medication Sig Dispense Refill  . clonazePAM (KLONOPIN) 0.5 MG tablet       . Doxylamine-PSE-APAP (TYLENOL SINUS NIGHT TIME PO) Take by mouth.      . DULoxetine (CYMBALTA) 30 MG capsule       . gabapentin (NEURONTIN) 300 MG capsule Take 1-2 capsules (300-600 mg total) by mouth 3 (three) times daily. Take 1  Capsule twice a day and 2 capsules at bedtime  360 capsule  1  . LORazepam (ATIVAN) 0.5 MG tablet Take 1 tablet (0.5 mg total) by mouth 2 (two) times daily as needed for anxiety.  60 tablet  1  . omeprazole (PRILOSEC) 20 MG capsule TAKE 2 CAPSULES BY MOUTH EVERY DAY  180 capsule  3  . sertraline (ZOLOFT) 50 MG tablet Take 1 tablet (50 mg total) by mouth daily.  30 tablet  11  . traMADol (ULTRAM) 50 MG tablet       . meclizine (ANTIVERT) 25 MG tablet TAKE 1 TABLET BY MOUTH 3 TIMES A DAY AS NEEDED  30 tablet  1   No current facility-administered medications for this visit.     REVIEW OF SYSTEMS:   All other systems were reviewed with the patient and are negative.  PHYSICAL EXAMINATION: ECOG PERFORMANCE STATUS: 1 - Symptomatic but completely ambulatory  Filed Vitals:   06/03/14 0917  BP: 163/80  Pulse: 66  Temp: 97.5 F (36.4 C)  Resp: 17   Filed Weights   06/03/14 0917  Weight:  136 lb 4.8 oz (61.825 kg)    GENERAL:alert, no distress and comfortable SKIN: skin color, texture, turgor are normal, no rashes or significant lesions. Her peripheral cyanosis NEURO: alert & oriented x 3 with fluent speech, no focal motor/sensory deficits  LABORATORY DATA:  I have reviewed the data as listed No results found for this or any previous visit (from the past 48 hour(s)).  Lab Results  Component Value Date   WBC 8.2 05/22/2014   HGB 13.6 05/22/2014   HCT 43.3 05/22/2014   MCV 91.0 05/22/2014   PLT 179 05/22/2014    RADIOGRAPHIC STUDIES: Skeletal survey showed no evidence of lytic lesions I have personally reviewed the radiological images as listed and agreed with the findings in the report.  ASSESSMENT & PLAN:  Abnormal serum protein electrophoresis I reassured the patient and family. They abnormal serum protein electrophoresis detected elsewhere was really polyclonal, likely related to undiagnosed autoimmune condition. Serum protein electrophoresis with immunofixation and 24-hour urine collection along with skeletal survey did not show clonal disease. She does not need to return.  Elevated antinuclear antibody (ANA) level She has elevated ANA, along with signs of arthritis and peripheral cyanosis, suggestive of connective tissue disease/possible component of vasculitis. I recommend referral to rheumatologist and she agreed.    All questions were answered. The patient knows to call the clinic with any  problems, questions or concerns. No barriers to learning was detected.  I spent 15 minutes counseling the patient face to face. The total time spent in the appointment was 20 minutes and more than 50% was on counseling.     [facility], [place], MD 06/03/2014 9:42 AM

## 2014-06-05 ENCOUNTER — Telehealth: Payer: Self-pay | Admitting: *Deleted

## 2014-06-05 NOTE — Telephone Encounter (Signed)
Received notice from [facility].. They scheduled pt for appt w/ Dr. Trudie Reed on 10/16 at 10:30 am.  Gave pt date/time information and phone number, address to their office.  She verbalized understanding.

## 2014-07-11 DIAGNOSIS — Z0279 Encounter for issue of other medical certificate: Secondary | ICD-10-CM

## 2014-07-12 ENCOUNTER — Telehealth: Payer: Self-pay | Admitting: *Deleted

## 2014-07-12 NOTE — Telephone Encounter (Signed)
No phone call 

## 2014-08-29 ENCOUNTER — Other Ambulatory Visit: Payer: Self-pay | Admitting: Internal Medicine

## 2014-08-29 NOTE — Telephone Encounter (Signed)
Done hardcopy to D

## 2014-08-30 NOTE — Telephone Encounter (Signed)
[facility] 

## 2014-09-02 ENCOUNTER — Other Ambulatory Visit: Payer: Self-pay | Admitting: Internal Medicine

## 2014-09-03 ENCOUNTER — Telehealth: Payer: Self-pay | Admitting: Internal Medicine

## 2014-09-03 NOTE — Telephone Encounter (Signed)
Pt called stated that [facility] [address] do not [facility] the refill that was fax in for Lorazepam on 08/30/14. Please check with them and see if we can call it in.

## 2014-09-03 NOTE — Telephone Encounter (Signed)
Please advise as T[facility], see telephone note.  Advise

## 2014-09-03 NOTE — Telephone Encounter (Signed)
Prescription phoned in as instructed by PCP.  Also called the patient to inform script should be at the pharmacy to pickup later this afternoon.

## 2014-09-03 NOTE — Telephone Encounter (Signed)
Ok to call in

## 2014-10-20 ENCOUNTER — Other Ambulatory Visit: Payer: Self-pay | Admitting: Internal Medicine

## 2014-10-30 ENCOUNTER — Other Ambulatory Visit: Payer: Self-pay | Admitting: Internal Medicine

## 2014-10-30 ENCOUNTER — Other Ambulatory Visit (INDEPENDENT_AMBULATORY_CARE_PROVIDER_SITE_OTHER): Payer: Medicare Other

## 2014-10-30 ENCOUNTER — Ambulatory Visit (INDEPENDENT_AMBULATORY_CARE_PROVIDER_SITE_OTHER): Payer: Medicare Other | Admitting: Internal Medicine

## 2014-10-30 ENCOUNTER — Encounter: Payer: Self-pay | Admitting: Internal Medicine

## 2014-10-30 VITALS — BP 174/114 | HR 72 | Temp 98.0°F | Ht 62.0 in | Wt 145.0 lb

## 2014-10-30 DIAGNOSIS — M79604 Pain in right leg: Secondary | ICD-10-CM

## 2014-10-30 DIAGNOSIS — R769 Abnormal immunological finding in serum, unspecified: Secondary | ICD-10-CM

## 2014-10-30 DIAGNOSIS — R778 Other specified abnormalities of plasma proteins: Secondary | ICD-10-CM

## 2014-10-30 DIAGNOSIS — M79605 Pain in left leg: Secondary | ICD-10-CM

## 2014-10-30 DIAGNOSIS — R300 Dysuria: Secondary | ICD-10-CM

## 2014-10-30 DIAGNOSIS — R32 Unspecified urinary incontinence: Secondary | ICD-10-CM

## 2014-10-30 DIAGNOSIS — I10 Essential (primary) hypertension: Secondary | ICD-10-CM

## 2014-10-30 LAB — URINALYSIS, ROUTINE W REFLEX MICROSCOPIC
Bilirubin Urine: NEGATIVE
HGB URINE DIPSTICK: NEGATIVE
Ketones, ur: NEGATIVE
Nitrite: NEGATIVE
PH: 5.5 (ref 5.0–8.0)
RBC / HPF: NONE SEEN (ref 0–?)
SPECIFIC GRAVITY, URINE: 1.025 (ref 1.000–1.030)
Total Protein, Urine: NEGATIVE
URINE GLUCOSE: NEGATIVE
Urobilinogen, UA: 0.2 (ref 0.0–1.0)

## 2014-10-30 MED ORDER — TRAMADOL HCL 50 MG PO TABS: 50.0000 mg | ORAL_TABLET | Freq: Three times a day (TID) | ORAL | Status: DC | PRN

## 2014-10-30 MED ORDER — AMLODIPINE BESYLATE 5 MG PO TABS: 5.0000 mg | ORAL_TABLET | Freq: Every day | ORAL | Status: DC

## 2014-10-30 MED ORDER — SERTRALINE HCL 100 MG PO TABS: ORAL_TABLET | ORAL | Status: DC

## 2014-10-30 MED ORDER — LORAZEPAM 0.5 MG PO TABS: ORAL_TABLET | ORAL | Status: DC

## 2014-10-30 MED ORDER — CEPHALEXIN 500 MG PO CAPS
500.0000 mg | ORAL_CAPSULE | Freq: Four times a day (QID) | ORAL | Status: DC
Start: 2014-10-30 — End: 2015-06-03

## 2014-10-30 NOTE — Progress Notes (Signed)
Subjective:    Patient ID: [name], female    DOB: [birth year], 81 y.o.   MRN: [id number]  HPI  Here with family friend with several things; c/o chronic LE pain, asks for tramadil increased to tid prn, denies worsening LBP, but seems some worse with ambulation, not clear if specifically joint related; Has also had mild worsening depressive symptoms, no suicidal ideation, or panic; has ongoing anxiety, asks for increased zoloft.  Does not want to try statin for lipids due to not wanting to take new med, except will accept BP med today.  Denies urinary symptoms such as frequency, urgency, flank pain, hematuria or n/v, fever, chills., but has had some mild incontinence on going for several yrs, asks for UA f/u with mild dysuria this am. No fever, Asks for handicap application for parking Past Medical History  Diagnosis Date  . ALLERGIC RHINITIS   . ANEMIA-NOS   . ANXIETY   . CERVICAL CANCER, HX OF   . DEGENERATIVE DISC DISEASE, LUMBOSACRAL SPINE W/RADICULOPATHY   . DEGENERATIVE JOINT DISEASE, ANKLE   . DEPRESSION   . GASTROENTERITIS/COLITIS D/T RADIATION   . GERD   . GLUCOSE INTOLERANCE   . IBS   . MALNUTRITION   . MENOPAUSAL DISORDER   . OSTEOPOROSIS   . Peripheral neuropathy 12/02/2011  . Hyperlipidemia 05/18/2012  . B12 deficiency 05/08/2014  . Cervical cancer   . Abnormal serum protein electrophoresis 05/20/2014  . Elevated antinuclear antibody (ANA) level 06/03/2014   Past Surgical History  Procedure Laterality Date  . Abdominal hysterectomy    . (l) hip fracture    . S/p sb resection and stricturoplasty  09/2000  . Appendectomy    . Oophorectomy      reports that she has never smoked. She has never used smokeless tobacco. She reports that she does not drink alcohol or use illicit drugs. family history includes Lung cancer in her daughter. No Known Allergies Current Outpatient Prescriptions on File Prior to Visit  Medication Sig Dispense Refill  . gabapentin (NEURONTIN)  300 MG capsule Take 1-2 capsules (300-600 mg total) by mouth 3 (three) times daily. Take 1  Capsule twice a day and 2 capsules at bedtime 360 capsule 1  . meclizine (ANTIVERT) 25 MG tablet TAKE 1 TABLET BY MOUTH 3 TIMES A DAY AS NEEDED 30 tablet 1  . omeprazole (PRILOSEC) 20 MG capsule TAKE 2 CAPSULES BY MOUTH EVERY DAY 180 capsule 3   No current facility-administered medications on file prior to visit.   Review of Systems  Constitutional: Negative for unusual diaphoresis or other sweats  HENT: Negative for ringing in ear Eyes: Negative for double vision or worsening visual disturbance.  Respiratory: Negative for choking and stridor.   Gastrointestinal: Negative for vomiting or other signifcant bowel change Genitourinary: Negative for hematuria or decreased urine volume.  Musculoskeletal: Negative for other MSK pain or swelling Skin: Negative for color change and worsening wound.  Neurological: Negative for tremors and numbness other than noted  Psychiatric/Behavioral: Negative for decreased concentration or agitation other than above  '     Objective:   Physical Exam BP 174/114 mmHg  Pulse 72  Temp(Src) 98 F (36.7 C) (Oral)  Ht 5\' 2"  (1.575 m)  Wt 145 lb (65.772 kg)  BMI 26.51 kg/m2 VS noted,  Constitutional: Pt appears well-developed, well-nourished.  HENT: Head: NCAT.  Right Ear: External ear normal.  Left Ear: External ear normal.  Eyes: . Pupils are equal, round, and reactive to light. Conjunctivae  and EOM are normal Neck: Normal range of motion. Neck supple.  Cardiovascular: Normal rate and regular rhythm.   Pulmonary/Chest: Effort normal and breath sounds somewhat decreased without rales or wheezing.  Abd:  Soft, NT, ND, + BS Neurological: Pt is alert. Not confused , motor grossly intact Skin: Skin is warm. No rash Psychiatric: Pt behavior is normal. No agitation.  No flank tender  BP Readings from Last 3 Encounters:  10/30/14 174/114  06/03/14 163/80  05/20/14  178/88       Assessment & Plan:

## 2014-10-30 NOTE — Patient Instructions (Signed)
Please take all new medication as prescribed - the increased tramadol, amlodipine 5 mg per day, and the increased zoloft to 100 mg per day  Please continue all other medications as before, and refills have been done if requested - the lorazepam.  Please have the pharmacy call with any other refills you may need.  Please continue your efforts at being more active, low cholesterol diet, and weight control.  You are otherwise up to date with prevention measures today.  Please keep your appointments with your specialists as you may have planned  You will be contacted regarding the referral for: Lower extremity Arterial Dopplers (for artery circulation check), as well as urology  You are given the handicap application today  Please return in 3 months, or sooner if needed

## 2014-10-30 NOTE — Progress Notes (Signed)
Pre visit review using our clinic review tool, if applicable. No additional management support is needed unless otherwise documented below in the visit note. 

## 2014-10-31 ENCOUNTER — Ambulatory Visit: Payer: Medicare Other | Admitting: Internal Medicine

## 2014-11-01 LAB — URINE CULTURE
COLONY COUNT: NO GROWTH
Organism ID, Bacteria: NO GROWTH

## 2014-11-04 DIAGNOSIS — I10 Essential (primary) hypertension: Secondary | ICD-10-CM | POA: Insufficient documentation

## 2014-11-04 NOTE — Assessment & Plan Note (Signed)
Also for urology referral, cont current tx till then

## 2014-11-04 NOTE — Assessment & Plan Note (Signed)
For UA, tx pending results, o/w stable overall by history and exam,  and pt to continue medical treatment as before,  to f/u any worsening symptoms or concerns

## 2014-11-04 NOTE — Assessment & Plan Note (Signed)
?   Joint vs claudication - also for LE arterial dopplers, cont same tx

## 2014-11-04 NOTE — Assessment & Plan Note (Signed)
Pt now accepts tx, for amlodipine 5 qd, monitor BP at home and next visit BP Readings from Last 3 Encounters:  10/30/14 174/114  06/03/14 163/80  05/20/14 178/88

## 2014-11-04 NOTE — Assessment & Plan Note (Addendum)
Not d/w pt, but assoc with tramadol for emr purpose, so on list

## 2014-11-08 ENCOUNTER — Ambulatory Visit: Payer: Medicare Other | Admitting: Internal Medicine

## 2015-04-21 ENCOUNTER — Other Ambulatory Visit: Payer: Self-pay | Admitting: Internal Medicine

## 2015-04-22 NOTE — Telephone Encounter (Signed)
Done hardcopy to Dahlia  

## 2015-04-22 NOTE — Telephone Encounter (Signed)
Rx faxed to pharmacy  

## 2015-05-12 ENCOUNTER — Other Ambulatory Visit: Payer: Self-pay | Admitting: Internal Medicine

## 2015-05-18 ENCOUNTER — Other Ambulatory Visit: Payer: Self-pay | Admitting: Internal Medicine

## 2015-05-20 NOTE — Telephone Encounter (Signed)
Done hardcopy to Dahlia  

## 2015-05-20 NOTE — Telephone Encounter (Signed)
Rx faxed to pharmacy  

## 2015-05-29 IMAGING — CR DG BONE SURVEY MET
9 of 10 series · 9 of 10 positions shown · non-contrast
Comparison: Right-sided rib radiographic series -08/05/2013; head
CT - 01/22/2014

CLINICAL DATA: Abnormal serum protein electrophoresis. Staging for
multiple myeloma. Patient reports back pain.

EXAM:
METASTATIC BONE SURVEY

[w chest pa]
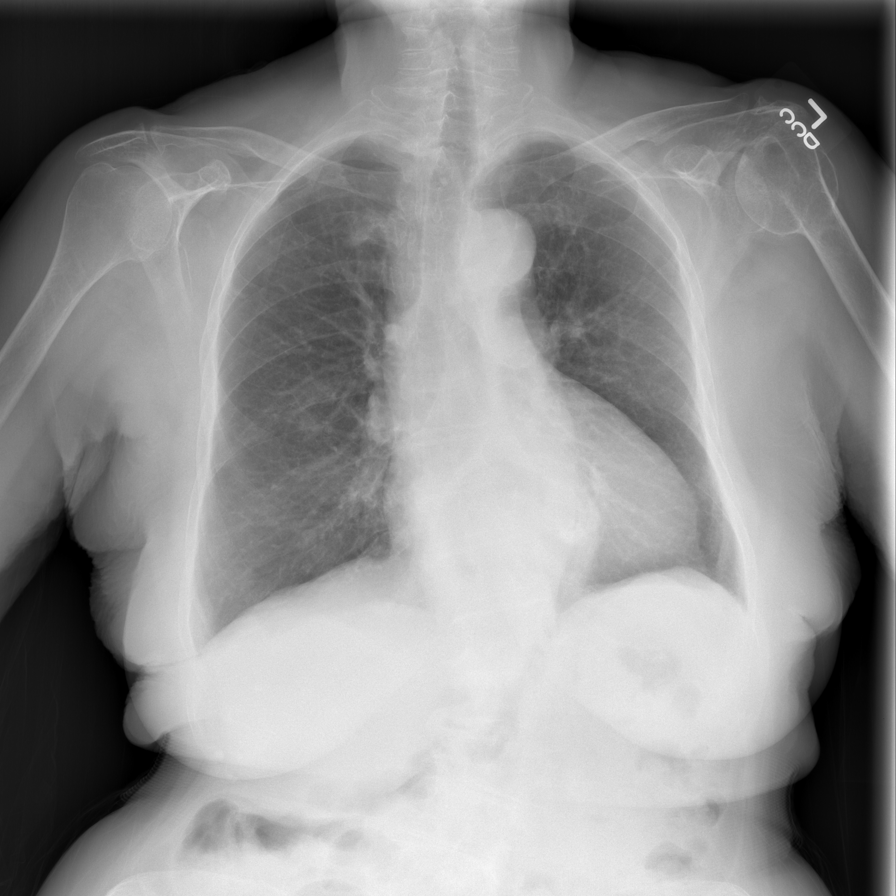

[w c-spine lat *]
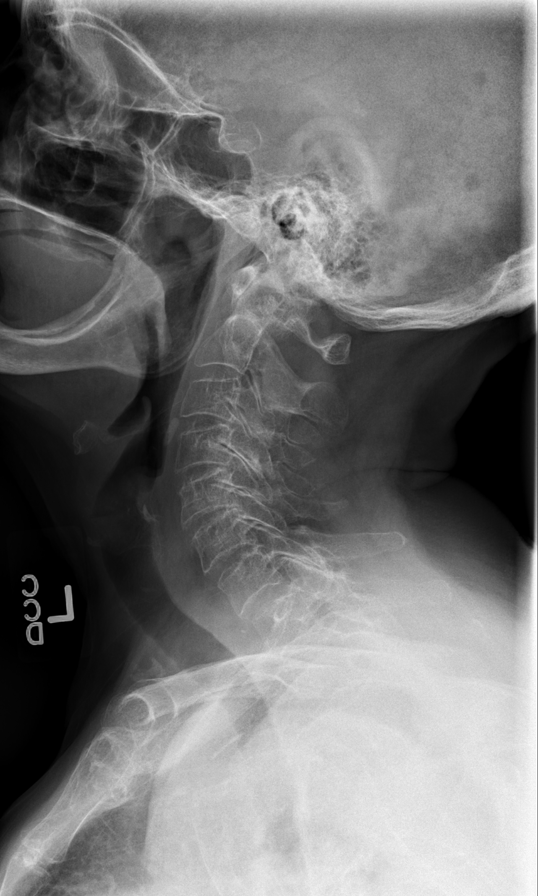

[w swimmers view *]
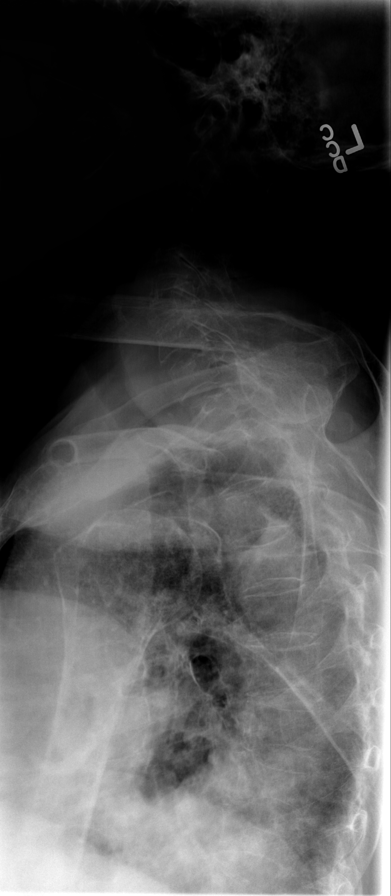

[w skull lat *]
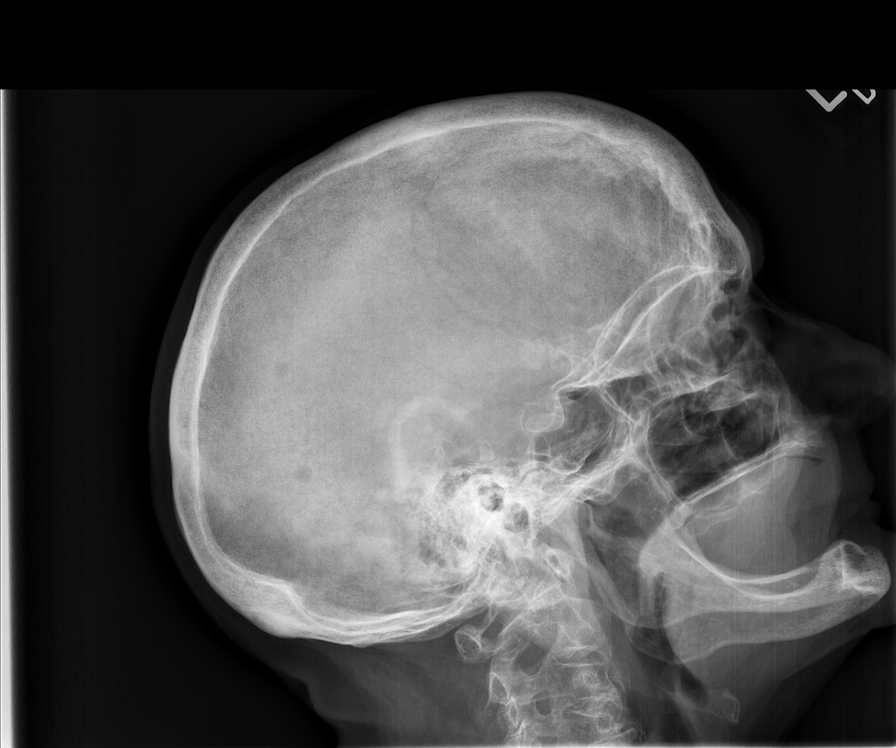

[w forearm ap left *]
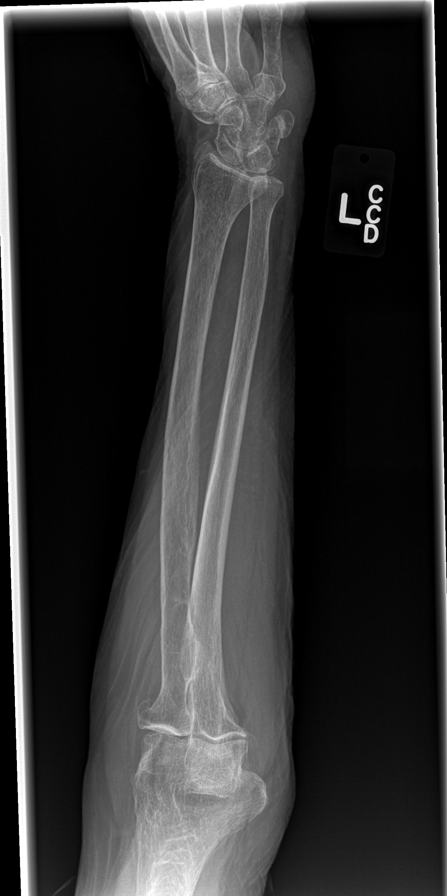

[w forearm ap right]
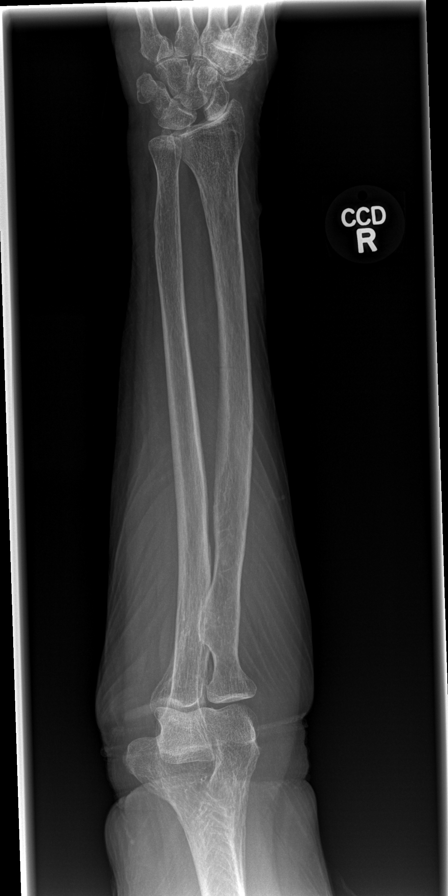

[t c-spine a.p.]
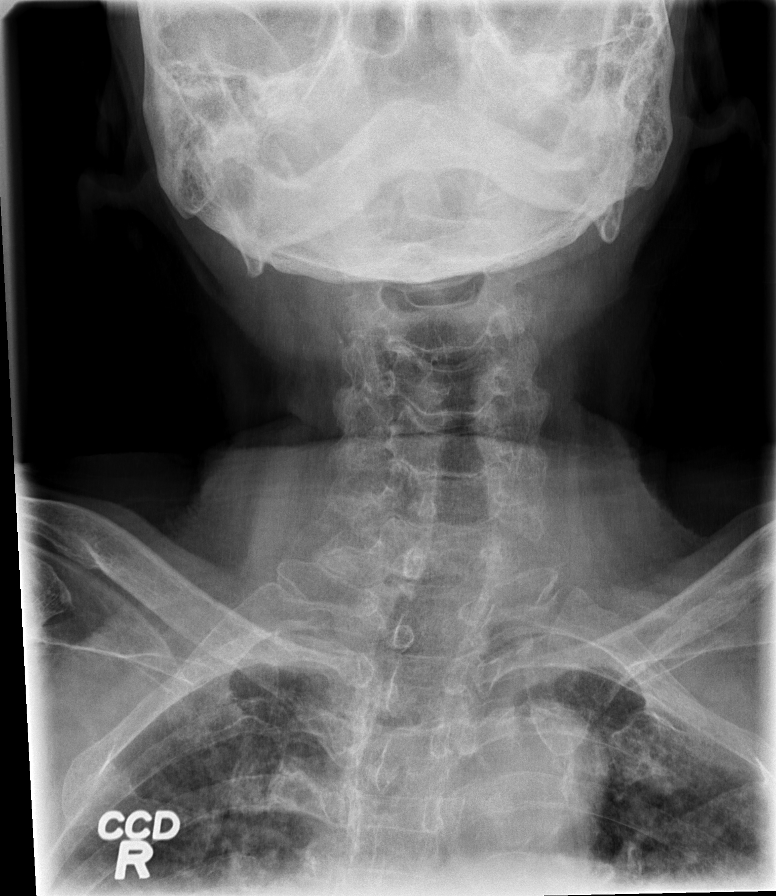

[t t-spine a.p.]
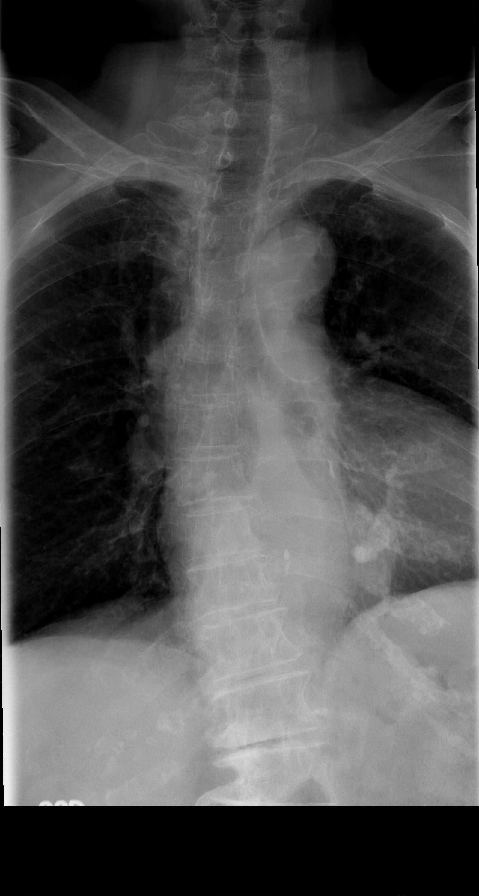

[t l-spine a.p.]
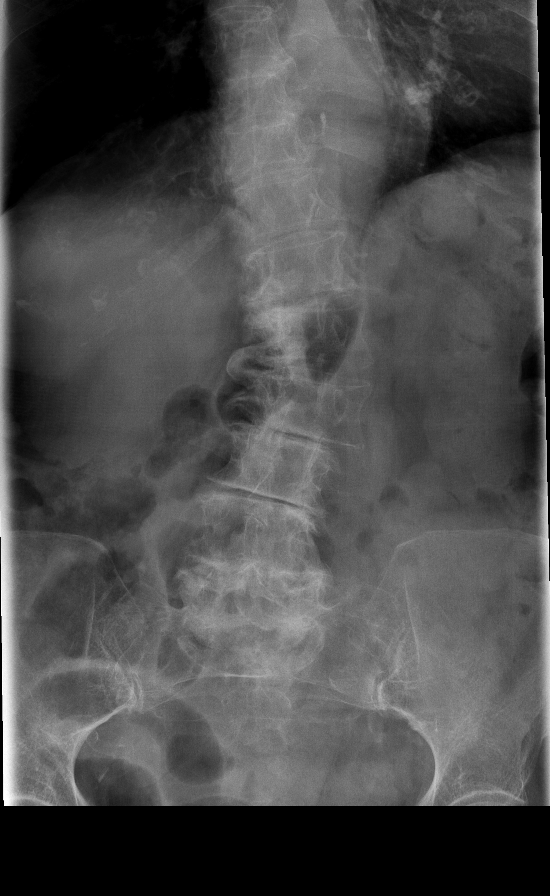

[9 of 10 positions shown; findings below may reference images not displayed]

FINDINGS: Chest radiograph: Grossly unchanged enlarged cardiac silhouette and
mediastinal contours with tortuosity of the thoracic aorta.
Calcifications within the mitral valve annulus. Retrocardiac air and
fluid containing structure compatible with a hiatal hernia. No focal
airspace opacities. No pleural effusion pneumothorax. No evidence of
edema.

Calvarium: There are 2 discrete lucencies within the posterior
parietal calvarium each measuring 6-7 mm in diameter, similar to
remote head CT performed 01/22/2014.

Cervical spine: No definite acute or aggressive osseus
abnormalities. Mildly accentuated cervical lordosis. Mild scoliotic
curvature of the cervical spine, convex to the left, possibly
positional.

Thoracic spine: Evaluation the superior aspect of the thoracic spine
is degraded secondary to overlying osseous and soft tissue
structures. No definite acute or aggressive osseous abnormalities.
Mild to moderate scoliotic curvature of the thoracolumbar spine with
dominant caudal component convex to the left.

Lumbar spine: No definite acute or aggressive osseus abnormalities.
Moderate scoliotic curvature of the lumbar spine, convex to the
left. Mild (under 25%) compression deformities involving the T12 and
L1 vertebral bodies, similar to prior right rib radiographic series.
Moderate to severe multilevel lumbar spine DDD.

AP pelvis: No acute or aggressive osseus abnormalities.

Left femur: No acute or aggressive osseous abnormalities. Post
sideplate and dynamic screw fixation of the left femoral neck
without evidence of hardware failure or loosening.

Left lower leg, no acute or aggressive osseus abnormalities.

Right femur: No acute or aggressive osseous abnormalities. Vascular
calcifications within the distal aspect of the right superficial
femoral artery.

Right lower leg: No acute or aggressive osseous abnormalities. Right
shoulder and upper arm: No acute or aggressive osseus abnormalities.

Right forearm:  No acute or aggressive osseous abnormalities.

Left shoulder and upper arm: Deformity involving the left proximal
humerus, likely the sequela of remote fracture. No acute or
aggressive osseous abnormalities.

Left forearm: No acute or aggressive osseus abnormalities.
IMPRESSION: 1. Punctate (6-7 mm) lucencies within the posterior aspect of the
parietal calvarium appears similar to head CT performed [DATE], and
thus are favored to represent venous lakes. No other discrete lucent
lesions to suggest myeloma/plasmacytoma.
2. Grossly unchanged mild (under 25%) compression deformities
involving the T12 and L1 vertebral bodies, similar to prior right
rib radiographic series performed [DATE]. Correlation for point
tenderness at these locations is recommended.
3. Moderate scoliotic curvature of the thoracolumbar spine with
moderate to severe multilevel lumbar spine DDD.

## 2015-06-03 ENCOUNTER — Encounter (HOSPITAL_COMMUNITY): Payer: Self-pay | Admitting: *Deleted

## 2015-06-03 ENCOUNTER — Emergency Department (HOSPITAL_COMMUNITY)
Admission: EM | Admit: 2015-06-03 | Discharge: 2015-06-03 | Disposition: A | Discharge status: Home / Self Care | Payer: Medicare Other | Attending: Physician Assistant | Admitting: Physician Assistant

## 2015-06-03 ENCOUNTER — Emergency Department (HOSPITAL_COMMUNITY): Payer: Medicare Other

## 2015-06-03 DIAGNOSIS — R531 Weakness: Secondary | ICD-10-CM | POA: Diagnosis present

## 2015-06-03 DIAGNOSIS — G629 Polyneuropathy, unspecified: Secondary | ICD-10-CM | POA: Diagnosis not present

## 2015-06-03 DIAGNOSIS — N390 Urinary tract infection, site not specified: Secondary | ICD-10-CM | POA: Insufficient documentation

## 2015-06-03 DIAGNOSIS — Z8742 Personal history of other diseases of the female genital tract: Secondary | ICD-10-CM | POA: Insufficient documentation

## 2015-06-03 DIAGNOSIS — M1990 Unspecified osteoarthritis, unspecified site: Secondary | ICD-10-CM | POA: Insufficient documentation

## 2015-06-03 DIAGNOSIS — F329 Major depressive disorder, single episode, unspecified: Secondary | ICD-10-CM | POA: Insufficient documentation

## 2015-06-03 DIAGNOSIS — K219 Gastro-esophageal reflux disease without esophagitis: Secondary | ICD-10-CM | POA: Diagnosis not present

## 2015-06-03 DIAGNOSIS — Z792 Long term (current) use of antibiotics: Secondary | ICD-10-CM | POA: Insufficient documentation

## 2015-06-03 DIAGNOSIS — Z8639 Personal history of other endocrine, nutritional and metabolic disease: Secondary | ICD-10-CM | POA: Insufficient documentation

## 2015-06-03 DIAGNOSIS — Z8541 Personal history of malignant neoplasm of cervix uteri: Secondary | ICD-10-CM | POA: Diagnosis not present

## 2015-06-03 DIAGNOSIS — Z862 Personal history of diseases of the blood and blood-forming organs and certain disorders involving the immune mechanism: Secondary | ICD-10-CM | POA: Insufficient documentation

## 2015-06-03 DIAGNOSIS — Z79899 Other long term (current) drug therapy: Secondary | ICD-10-CM | POA: Diagnosis not present

## 2015-06-03 LAB — CBC WITH DIFFERENTIAL/PLATELET
BASOS ABS: 0 10*3/uL (ref 0.0–0.1)
Basophils Relative: 0 % (ref 0–1)
EOS ABS: 0 10*3/uL (ref 0.0–0.7)
EOS PCT: 0 % (ref 0–5)
HCT: 42.3 % (ref 36.0–46.0)
Hemoglobin: 13.4 g/dL (ref 12.0–15.0)
Lymphocytes Relative: 4 % — ABNORMAL LOW (ref 12–46)
Lymphs Abs: 0.6 10*3/uL — ABNORMAL LOW (ref 0.7–4.0)
MCH: 28 pg (ref 26.0–34.0)
MCHC: 31.7 g/dL (ref 30.0–36.0)
MCV: 88.3 fL (ref 78.0–100.0)
Monocytes Absolute: 0.9 10*3/uL (ref 0.1–1.0)
Monocytes Relative: 5 % (ref 3–12)
Neutro Abs: 16.3 10*3/uL — ABNORMAL HIGH (ref 1.7–7.7)
Neutrophils Relative %: 92 % — ABNORMAL HIGH (ref 43–77)
PLATELETS: 189 10*3/uL (ref 150–400)
RBC: 4.79 MIL/uL (ref 3.87–5.11)
RDW: 15.4 % (ref 11.5–15.5)
WBC: 17.8 10*3/uL — AB (ref 4.0–10.5)

## 2015-06-03 LAB — URINALYSIS, ROUTINE W REFLEX MICROSCOPIC
Bilirubin Urine: NEGATIVE
Glucose, UA: NEGATIVE mg/dL
Ketones, ur: NEGATIVE mg/dL
Nitrite: POSITIVE — AB
PROTEIN: NEGATIVE mg/dL
Specific Gravity, Urine: 1.01 (ref 1.005–1.030)
UROBILINOGEN UA: 0.2 mg/dL (ref 0.0–1.0)
pH: 7 (ref 5.0–8.0)

## 2015-06-03 LAB — COMPREHENSIVE METABOLIC PANEL
ALBUMIN: 3.3 g/dL — AB (ref 3.5–5.0)
ALT: 15 U/L (ref 14–54)
AST: 23 U/L (ref 15–41)
Alkaline Phosphatase: 65 U/L (ref 38–126)
Anion gap: 8 (ref 5–15)
BUN: 15 mg/dL (ref 6–20)
CHLORIDE: 107 mmol/L (ref 101–111)
CO2: 26 mmol/L (ref 22–32)
CREATININE: 1.23 mg/dL — AB (ref 0.44–1.00)
Calcium: 8.9 mg/dL (ref 8.9–10.3)
GFR calc Af Amer: 44 mL/min — ABNORMAL LOW (ref >60)
GFR calc non Af Amer: 38 mL/min — ABNORMAL LOW (ref >60)
Glucose, Bld: 126 mg/dL — ABNORMAL HIGH (ref 65–99)
Potassium: 3.9 mmol/L (ref 3.5–5.1)
SODIUM: 141 mmol/L (ref 135–145)
Total Bilirubin: 0.8 mg/dL (ref 0.3–1.2)
Total Protein: 6.8 g/dL (ref 6.5–8.1)

## 2015-06-03 LAB — URINE MICROSCOPIC-ADD ON

## 2015-06-03 LAB — I-STAT TROPONIN, ED: TROPONIN I, POC: 0.02 ng/mL (ref 0.00–0.08)

## 2015-06-03 MED ORDER — CEPHALEXIN 500 MG PO CAPS: 500.0000 mg | ORAL_CAPSULE | Freq: Two times a day (BID) | ORAL | Status: DC

## 2015-06-03 MED ORDER — CEPHALEXIN 500 MG PO CAPS
500.0000 mg | ORAL_CAPSULE | Freq: Once | ORAL | Status: AC
  Administered 2015-06-03: 500 mg via ORAL
  Filled 2015-06-03: qty 1

## 2015-06-03 NOTE — Discharge Instructions (Signed)
Asymptomatic Bacteriuria Asymptomatic bacteriuria is the presence of a large number of bacteria in your urine without the usual symptoms of burning or frequent urination. The following conditions increase the risk of asymptomatic bacteriuria:  Diabetes mellitus.  Advanced age.  Pregnancy in the first trimester.  Kidney stones.  Kidney transplants.  Leaky kidney tube valve in young children (reflux). Treatment for this condition is not needed in most people and can lead to other problems such as too much yeast and growth of resistant bacteria. However, some people, such as pregnant women, do need treatment to prevent kidney infection. Asymptomatic bacteriuria in pregnancy is also associated with fetal growth restriction, premature labor, and newborn death. HOME CARE INSTRUCTIONS Monitor your condition for any changes. The following actions may help to relieve any discomfort you are feeling:  Drink enough water and fluids to keep your urine clear or pale yellow. Go to the bathroom more often to keep your bladder empty.  Keep the area around your vagina and rectum clean. Wipe yourself from front to back after urinating. SEEK IMMEDIATE MEDICAL CARE IF:  You develop signs of an infection such as:  Burning with urination.  Frequency of voiding.  Back pain.  Fever.  You have blood in the urine.  You develop a fever. MAKE SURE YOU:  Understand these instructions.  Will watch your condition.  Will get help right away if you are not doing well or get worse. Document Released: 09/06/2005 Document Revised: 01/21/2014 Document Reviewed: 02/26/2013 Exit[employer] Patient Information 2015 [employer]. This information is not intended to replace advice given to you by your health care provider. Make sure you discuss any questions you have with your health care provider.  

## 2015-06-03 NOTE — ED Provider Notes (Signed)
CSN: [id number]     Arrival date & time 06/03/15  5027 History   First MD Initiated Contact with Patient 06/03/15 [phone]     Chief Complaint  Patient presents with  . Weakness     (Consider location/radiation/quality/duration/timing/severity/associated sxs/prior Treatment) HPI   Patient is an 81 year old female who lives by herself presenting today with weakness. Patient's friend is in the room with her and able to give coherent story. She said that patient passed the emergency button near toilet in her room twice today because she was feeling so weak.  Patient endorsed that she's not been feeling herself for the last couple days. No nausea no vomiting or diarrhea. No chest pain or shortness breath.  Past Medical History  Diagnosis Date  . ALLERGIC RHINITIS   . ANEMIA-NOS   . ANXIETY   . CERVICAL CANCER, HX OF   . DEGENERATIVE DISC DISEASE, LUMBOSACRAL SPINE W/RADICULOPATHY   . DEGENERATIVE JOINT DISEASE, ANKLE   . DEPRESSION   . GASTROENTERITIS/COLITIS D/T RADIATION   . GERD   . GLUCOSE INTOLERANCE   . IBS   . MALNUTRITION   . MENOPAUSAL DISORDER   . OSTEOPOROSIS   . Peripheral neuropathy 12/02/2011  . Hyperlipidemia 05/18/2012  . B12 deficiency 05/08/2014  . Cervical cancer   . Abnormal serum protein electrophoresis 05/20/2014  . Elevated antinuclear antibody (ANA) level 06/03/2014   Past Surgical History  Procedure Laterality Date  . Abdominal hysterectomy    . (l) hip fracture    . S/p sb resection and stricturoplasty  09/2000  . Appendectomy    . Oophorectomy     Family History  Problem Relation Age of Onset  . Lung cancer Daughter    Social History  Substance Use Topics  . Smoking status: Never Smoker   . Smokeless tobacco: Never Used  . Alcohol Use: No   OB History    No data available     Review of Systems  Constitutional: Negative for activity change and fatigue.  HENT: Negative for congestion and drooling.   Eyes: Negative for discharge.  Respiratory:  Negative for cough and chest tightness.   Cardiovascular: Negative for chest pain.  Gastrointestinal: Negative for abdominal distention.  Genitourinary: Negative for dysuria and difficulty urinating.  Musculoskeletal: Negative for joint swelling.  Skin: Negative for rash.  Allergic/Immunologic: Negative for immunocompromised state.  Neurological: Positive for weakness. Negative for seizures and speech difficulty.  Psychiatric/Behavioral: Negative for behavioral problems and agitation.      Allergies  Review of patient's allergies indicates no known allergies.  Home Medications   Prior to Admission medications   Medication Sig Start Date End Date Taking? Authorizing Provider  amLODipine (NORVASC) 5 MG tablet Take 1 tablet (5 mg total) by mouth daily. 10/30/14 10/30/15 Yes Biagio Borg, MD  cephALEXin (KEFLEX) 500 MG capsule Take 1 capsule (500 mg total) by mouth 4 (four) times daily. 10/30/14   Biagio Borg, MD  gabapentin (NEURONTIN) 300 MG capsule TAKE 1 CAPSULE BY MOUTH TWICE A DAY AND 2 CAPSULES AT BEDTIME AS DIRECTED 05/12/15   Biagio Borg, MD  LORazepam (ATIVAN) 0.5 MG tablet TAKE 1 TABLET BY MOUTH TWICE A DAY AS NEEDED FOR ANXIETY 05/20/15   Biagio Borg, MD  meclizine (ANTIVERT) 25 MG tablet TAKE 1 TABLET BY MOUTH 3 TIMES A DAY AS NEEDED 12/02/12   Biagio Borg, MD  omeprazole (PRILOSEC) 20 MG capsule TAKE 2 CAPSULES BY MOUTH EVERY DAY 05/08/14   Biagio Borg,  MD  sertraline (ZOLOFT) 100 MG tablet TAKE 1 TABLET BY MOUTH ONCE EVERY MORNING WITH FOOD 10/30/14   Biagio Borg, MD  traMADol (ULTRAM) 50 MG tablet TAKE 1 TABLET BY MOUTH 3 TIMES A DAY AS NEEDED 04/22/15   Biagio Borg, MD   BP 158/85 mmHg  Pulse 94  Temp(Src) 98.8 F (37.1 C) (Oral)  Resp 20  SpO2 93% Physical Exam  Constitutional: She appears well-developed and well-nourished.  frail  HENT:  Head: Normocephalic and atraumatic.  Eyes: Conjunctivae are normal. Right eye exhibits no discharge.  Neck: Neck supple.   Cardiovascular: Normal rate, regular rhythm and normal heart sounds.   No murmur heard. Pulmonary/Chest: Effort normal and breath sounds normal. She has no wheezes. She has no rales.  Abdominal: Soft. She exhibits no distension. There is no tenderness.  Musculoskeletal: Normal range of motion. She exhibits no edema.  Neurological: No cranial nerve deficit.  Mild confusion/difficulty with memory  Skin: Skin is warm and dry. No rash noted. She is not diaphoretic.  Nursing note and vitals reviewed.   ED Course  Procedures (including critical care time) Labs Review Labs Reviewed - No data to display  Imaging Review No results found. I have personally reviewed and evaluated these images and lab results as part of my medical decision-making.   EKG Interpretation   Date/Time:  Tuesday June 03 2015 09:59:16 EDT Ventricular Rate:  98 PR Interval:  193 QRS Duration: 87 QT Interval:  383 QTC Calculation: 489 R Axis:   -25 Text Interpretation:  Sinus tachycardia Ventricular premature complex  Borderline left axis deviation Borderline T wave abnormalities Borderline  prolonged QT interval no acute ischemia No significant change since last  tracing Confirmed by Gerald Leitz ([id number]) on 06/03/2015 10:02:30 AM      MDM   Final diagnoses:  None    Patient is an 81 year old frail female who lives independently presenting today with weakness. Patient unable to give clear story however friend reports that her independent living called the paramedics because she pressed the button to the toilet twice for emergencies. Patient states that she's been feeling weak. However no focal symptoms whatsoever. Patient has history of UTI.  We will look for source of infection including chest x-ray urinalysis and labs. Patient uses walker at home. We'll make sure the patient is able to ambulate at baseline for discharge. However her exam is benign and she has normal vital signs at this time which  are reassuring.  Courteney Julio Alm, MD 06/06/15 1539

## 2015-06-03 NOTE — ED Notes (Signed)
Bed: AV69 Expected date:  Expected time:  Means of arrival:  Comments: EMS- 81yo F, generalized weakness/frequent UTIs

## 2015-06-03 NOTE — ED Notes (Signed)
Pt from [facility] on [address], [place] patient began feeling increasing weak, unable to get off toilet  She does have a history of frequent UTI's

## 2015-06-03 NOTE — ED Notes (Signed)
Pt refused i&o cath but was able to void on the bedpan.  Urine sample sent to lab.

## 2015-06-05 LAB — URINE CULTURE: Culture: >=100000

## 2015-06-07 ENCOUNTER — Telehealth (HOSPITAL_COMMUNITY): Payer: Self-pay | Admitting: Emergency Medicine

## 2015-06-07 NOTE — Telephone Encounter (Signed)
Post ED Visit - Positive Culture Follow-up  Culture report reviewed by antimicrobial stewardship pharmacist:  []  Heide Guile, Pharm.D., BCPS []  Alycia Rossetti, Pharm.D., BCPS []  [place], Pharm.D., BCPS, AAHIVP []  Legrand Como, Pharm.D., BCPS, AAHIVP []  [place], Pharm.D. [x]  Cassie Nicole Kindred, [place].D.  Positive Urine culture Treated with Cephalexin, organism sensitive to the same and no further patient follow-up is required at this time.  Ernesta Amble 06/07/2015, 4:55 PM

## 2015-06-14 ENCOUNTER — Other Ambulatory Visit: Payer: Self-pay | Admitting: Internal Medicine

## 2015-06-27 ENCOUNTER — Telehealth: Payer: Self-pay | Admitting: Internal Medicine

## 2015-06-27 NOTE — Telephone Encounter (Signed)
[name] called wanting to ask about getting a nurse out to visit patient a few times a week. Also, her leg is still bothering her.  Can you please call her at [phone]

## 2015-06-27 NOTE — Telephone Encounter (Signed)
Please advise that pt needs a return office visit

## 2015-06-30 NOTE — Telephone Encounter (Signed)
Appointment made

## 2015-07-02 ENCOUNTER — Ambulatory Visit (INDEPENDENT_AMBULATORY_CARE_PROVIDER_SITE_OTHER): Payer: Medicare Other | Admitting: Internal Medicine

## 2015-07-02 ENCOUNTER — Telehealth: Payer: Self-pay | Admitting: Internal Medicine

## 2015-07-02 ENCOUNTER — Encounter: Payer: Self-pay | Admitting: Internal Medicine

## 2015-07-02 ENCOUNTER — Emergency Department (HOSPITAL_COMMUNITY): Payer: Medicare Other

## 2015-07-02 ENCOUNTER — Inpatient Hospital Stay (HOSPITAL_COMMUNITY)
Admission: EM | Admit: 2015-07-02 | Discharge: 2015-07-07 | DRG: 640 | Disposition: A | Discharge status: Skilled Nursing Facility | Payer: Medicare Other | Attending: Internal Medicine | Admitting: Internal Medicine

## 2015-07-02 ENCOUNTER — Encounter (HOSPITAL_COMMUNITY): Payer: Self-pay | Admitting: *Deleted

## 2015-07-02 VITALS — BP 138/88 | HR 46 | Temp 99.1°F | Resp 18 | Wt 114.0 lb

## 2015-07-02 DIAGNOSIS — R6510 Systemic inflammatory response syndrome (SIRS) of non-infectious origin without acute organ dysfunction: Secondary | ICD-10-CM | POA: Diagnosis present

## 2015-07-02 DIAGNOSIS — M810 Age-related osteoporosis without current pathological fracture: Secondary | ICD-10-CM | POA: Diagnosis present

## 2015-07-02 DIAGNOSIS — E872 Acidosis, unspecified: Secondary | ICD-10-CM | POA: Diagnosis present

## 2015-07-02 DIAGNOSIS — I481 Persistent atrial fibrillation: Secondary | ICD-10-CM | POA: Diagnosis present

## 2015-07-02 DIAGNOSIS — E43 Unspecified severe protein-calorie malnutrition: Secondary | ICD-10-CM | POA: Diagnosis present

## 2015-07-02 DIAGNOSIS — E860 Dehydration: Secondary | ICD-10-CM | POA: Diagnosis not present

## 2015-07-02 DIAGNOSIS — E538 Deficiency of other specified B group vitamins: Secondary | ICD-10-CM | POA: Diagnosis present

## 2015-07-02 DIAGNOSIS — I499 Cardiac arrhythmia, unspecified: Secondary | ICD-10-CM

## 2015-07-02 DIAGNOSIS — K219 Gastro-esophageal reflux disease without esophagitis: Secondary | ICD-10-CM | POA: Diagnosis present

## 2015-07-02 DIAGNOSIS — R627 Adult failure to thrive: Secondary | ICD-10-CM

## 2015-07-02 DIAGNOSIS — Z6821 Body mass index (BMI) 21.0-21.9, adult: Secondary | ICD-10-CM

## 2015-07-02 DIAGNOSIS — Z66 Do not resuscitate: Secondary | ICD-10-CM | POA: Diagnosis present

## 2015-07-02 DIAGNOSIS — F419 Anxiety disorder, unspecified: Secondary | ICD-10-CM | POA: Diagnosis present

## 2015-07-02 DIAGNOSIS — Z8541 Personal history of malignant neoplasm of cervix uteri: Secondary | ICD-10-CM | POA: Diagnosis not present

## 2015-07-02 DIAGNOSIS — E46 Unspecified protein-calorie malnutrition: Secondary | ICD-10-CM | POA: Diagnosis present

## 2015-07-02 DIAGNOSIS — R531 Weakness: Secondary | ICD-10-CM | POA: Diagnosis present

## 2015-07-02 DIAGNOSIS — K589 Irritable bowel syndrome without diarrhea: Secondary | ICD-10-CM | POA: Diagnosis present

## 2015-07-02 DIAGNOSIS — I4891 Unspecified atrial fibrillation: Secondary | ICD-10-CM | POA: Diagnosis not present

## 2015-07-02 DIAGNOSIS — G629 Polyneuropathy, unspecified: Secondary | ICD-10-CM | POA: Diagnosis present

## 2015-07-02 DIAGNOSIS — F329 Major depressive disorder, single episode, unspecified: Secondary | ICD-10-CM | POA: Diagnosis present

## 2015-07-02 DIAGNOSIS — N179 Acute kidney failure, unspecified: Secondary | ICD-10-CM | POA: Diagnosis present

## 2015-07-02 DIAGNOSIS — I480 Paroxysmal atrial fibrillation: Secondary | ICD-10-CM | POA: Diagnosis not present

## 2015-07-02 DIAGNOSIS — E785 Hyperlipidemia, unspecified: Secondary | ICD-10-CM | POA: Diagnosis present

## 2015-07-02 DIAGNOSIS — I959 Hypotension, unspecified: Secondary | ICD-10-CM | POA: Diagnosis present

## 2015-07-02 DIAGNOSIS — I4819 Other persistent atrial fibrillation: Secondary | ICD-10-CM | POA: Insufficient documentation

## 2015-07-02 DIAGNOSIS — I10 Essential (primary) hypertension: Secondary | ICD-10-CM

## 2015-07-02 DIAGNOSIS — Z79899 Other long term (current) drug therapy: Secondary | ICD-10-CM

## 2015-07-02 DIAGNOSIS — G8929 Other chronic pain: Secondary | ICD-10-CM | POA: Diagnosis present

## 2015-07-02 LAB — CBC
HEMATOCRIT: 39 % (ref 36.0–46.0)
HEMOGLOBIN: 12 g/dL (ref 12.0–15.0)
MCH: 26.8 pg (ref 26.0–34.0)
MCHC: 30.8 g/dL (ref 30.0–36.0)
MCV: 87.1 fL (ref 78.0–100.0)
Platelets: 246 10*3/uL (ref 150–400)
RBC: 4.48 MIL/uL (ref 3.87–5.11)
RDW: 15.3 % (ref 11.5–15.5)
WBC: 15.8 10*3/uL — ABNORMAL HIGH (ref 4.0–10.5)

## 2015-07-02 LAB — BASIC METABOLIC PANEL
ANION GAP: 15 (ref 5–15)
BUN: 18 mg/dL (ref 6–20)
CALCIUM: 8.7 mg/dL — AB (ref 8.9–10.3)
CO2: 23 mmol/L (ref 22–32)
Chloride: 97 mmol/L — ABNORMAL LOW (ref 101–111)
Creatinine, Ser: 1.64 mg/dL — ABNORMAL HIGH (ref 0.44–1.00)
GFR calc Af Amer: 31 mL/min — ABNORMAL LOW (ref >60)
GFR calc non Af Amer: 27 mL/min — ABNORMAL LOW (ref >60)
GLUCOSE: 99 mg/dL (ref 65–99)
Potassium: 3.7 mmol/L (ref 3.5–5.1)
Sodium: 135 mmol/L (ref 135–145)

## 2015-07-02 LAB — TROPONIN I

## 2015-07-02 LAB — URINALYSIS, ROUTINE W REFLEX MICROSCOPIC
GLUCOSE, UA: NEGATIVE mg/dL
Hgb urine dipstick: NEGATIVE
Ketones, ur: 15 mg/dL — AB
LEUKOCYTES UA: NEGATIVE
Nitrite: NEGATIVE
PH: 5 (ref 5.0–8.0)
Protein, ur: NEGATIVE mg/dL
Specific Gravity, Urine: 1.021 (ref 1.005–1.030)
Urobilinogen, UA: 0.2 mg/dL (ref 0.0–1.0)

## 2015-07-02 LAB — TSH: TSH: 0.439 u[IU]/mL (ref 0.350–4.500)

## 2015-07-02 LAB — I-STAT CG4 LACTIC ACID, ED
Lactic Acid, Venous: 1.91 mmol/L (ref 0.5–2.0)
Lactic Acid, Venous: 2.78 mmol/L (ref 0.5–2.0)

## 2015-07-02 LAB — CK: CK TOTAL: 37 U/L — AB (ref 38–234)

## 2015-07-02 LAB — RAPID STREP SCREEN (MED CTR MEBANE ONLY): Streptococcus, Group A Screen (Direct): NEGATIVE

## 2015-07-02 MED ORDER — LORAZEPAM 0.5 MG PO TABS
0.5000 mg | ORAL_TABLET | Freq: Two times a day (BID) | ORAL | Status: DC | PRN
  Administered 2015-07-03: 0.5 mg via ORAL
  Filled 2015-07-02: qty 1

## 2015-07-02 MED ORDER — DILTIAZEM LOAD VIA INFUSION
10.0000 mg | Freq: Once | INTRAVENOUS | Status: AC
  Administered 2015-07-02: 10 mg via INTRAVENOUS
  Filled 2015-07-02: qty 10

## 2015-07-02 MED ORDER — HEPARIN BOLUS VIA INFUSION
2600.0000 [IU] | Freq: Once | INTRAVENOUS | Status: AC
  Administered 2015-07-02: 2600 [IU] via INTRAVENOUS
  Filled 2015-07-02: qty 2600

## 2015-07-02 MED ORDER — DEXTROSE 5 % IV SOLN
5.0000 mg/h | INTRAVENOUS | Status: DC
  Administered 2015-07-02: 5 mg/h via INTRAVENOUS
  Administered 2015-07-03 – 2015-07-04 (×3): 10 mg/h via INTRAVENOUS
  Administered 2015-07-04: 5 mg/h via INTRAVENOUS
  Filled 2015-07-02 (×6): qty 100

## 2015-07-02 MED ORDER — TRAMADOL HCL 50 MG PO TABS
50.0000 mg | ORAL_TABLET | Freq: Two times a day (BID) | ORAL | Status: DC | PRN
  Administered 2015-07-03 – 2015-07-06 (×4): 50 mg via ORAL
  Filled 2015-07-02 (×4): qty 1

## 2015-07-02 MED ORDER — HYDROCODONE-ACETAMINOPHEN 5-325 MG PO TABS
0.5000 | ORAL_TABLET | Freq: Every day | ORAL | Status: DC | PRN
  Administered 2015-07-03: 0.5 via ORAL
  Filled 2015-07-02: qty 1

## 2015-07-02 MED ORDER — SODIUM CHLORIDE 0.9 % IV BOLUS (SEPSIS)
1000.0000 mL | Freq: Once | INTRAVENOUS | Status: AC
  Administered 2015-07-02: 1000 mL via INTRAVENOUS

## 2015-07-02 MED ORDER — HEPARIN (PORCINE) IN NACL 100-0.45 UNIT/ML-% IJ SOLN
950.0000 [IU]/h | INTRAMUSCULAR | Status: DC
  Administered 2015-07-02: 750 [IU]/h via INTRAVENOUS
  Administered 2015-07-03: 900 [IU]/h via INTRAVENOUS
  Administered 2015-07-04: 950 [IU]/h via INTRAVENOUS
  Filled 2015-07-02 (×3): qty 250

## 2015-07-02 MED ORDER — SODIUM CHLORIDE 0.9 % IV SOLN: INTRAVENOUS | Status: AC

## 2015-07-02 MED ORDER — ACETAMINOPHEN 500 MG PO TABS
500.0000 mg | ORAL_TABLET | Freq: Four times a day (QID) | ORAL | Status: DC | PRN
  Administered 2015-07-06: 500 mg via ORAL
  Filled 2015-07-02: qty 1

## 2015-07-02 MED ORDER — SODIUM CHLORIDE 0.9 % IV SOLN
Freq: Once | INTRAVENOUS | Status: AC
  Administered 2015-07-02: 16:00:00 via INTRAVENOUS

## 2015-07-02 MED ORDER — ACETAMINOPHEN 325 MG PO TABS
650.0000 mg | ORAL_TABLET | Freq: Once | ORAL | Status: AC
  Administered 2015-07-02: 650 mg via ORAL
  Filled 2015-07-02: qty 2

## 2015-07-02 MED ORDER — AMLODIPINE BESYLATE 5 MG PO TABS
5.0000 mg | ORAL_TABLET | Freq: Every day | ORAL | Status: DC
Start: 2015-07-03 — End: 2015-07-03

## 2015-07-02 MED ORDER — ADULT MULTIVITAMIN W/MINERALS CH
1.0000 | ORAL_TABLET | Freq: Every day | ORAL | Status: DC
  Administered 2015-07-02 – 2015-07-07 (×5): 1 via ORAL
  Filled 2015-07-02 (×5): qty 1

## 2015-07-02 MED ORDER — SODIUM CHLORIDE 0.9 % IJ SOLN
3.0000 mL | Freq: Two times a day (BID) | INTRAMUSCULAR | Status: DC
  Administered 2015-07-03 – 2015-07-07 (×5): 3 mL via INTRAVENOUS

## 2015-07-02 MED ORDER — PANTOPRAZOLE SODIUM 40 MG PO TBEC
40.0000 mg | DELAYED_RELEASE_TABLET | Freq: Every day | ORAL | Status: DC
  Administered 2015-07-03 – 2015-07-07 (×5): 40 mg via ORAL
  Filled 2015-07-02 (×6): qty 1

## 2015-07-02 MED ORDER — SERTRALINE HCL 100 MG PO TABS
100.0000 mg | ORAL_TABLET | Freq: Every day | ORAL | Status: DC
  Administered 2015-07-03 – 2015-07-07 (×5): 100 mg via ORAL
  Filled 2015-07-02 (×5): qty 1

## 2015-07-02 MED ORDER — ASPIRIN EC 81 MG PO TBEC
81.0000 mg | DELAYED_RELEASE_TABLET | Freq: Every day | ORAL | Status: DC
  Administered 2015-07-02 – 2015-07-07 (×6): 81 mg via ORAL
  Filled 2015-07-02 (×6): qty 1

## 2015-07-02 MED ORDER — BOOST / RESOURCE BREEZE PO LIQD
1.0000 | Freq: Three times a day (TID) | ORAL | Status: DC
  Administered 2015-07-02 – 2015-07-07 (×5): 1 via ORAL

## 2015-07-02 NOTE — Patient Instructions (Addendum)
We have called [facility] and told them you are on your way.  They will be able to evaluate your symptoms better than we can do as an outpatient.

## 2015-07-02 NOTE — ED Notes (Signed)
MD at bedside. 

## 2015-07-02 NOTE — ED Notes (Signed)
Admitting at bedside 

## 2015-07-02 NOTE — Progress Notes (Addendum)
ANTICOAGULATION CONSULT NOTE - Initial Consult  Pharmacy Consult for heparin Indication: atrial fibrillation  No Known Allergies  Patient Measurements:   Heparin Dosing Weight: 51.7 kg  Vital Signs: Temp: 99.8 F (37.7 C) (10/12 1536) Temp Source: Rectal (10/12 1536) BP: 99/78 mmHg (10/12 1716) Pulse Rate: 139 (10/12 1706)  Labs:  Recent Labs  07/02/15 1315  HGB 12.0  HCT 39.0  PLT 246  CREATININE 1.64*  CKTOTAL 37*  TROPONINI <0.03    CrCl cannot be calculated (Unknown ideal weight.).   Medical History: Past Medical History  Diagnosis Date  . ALLERGIC RHINITIS   . ANEMIA-NOS   . ANXIETY   . CERVICAL CANCER, HX OF   . DEGENERATIVE DISC DISEASE, LUMBOSACRAL SPINE W/RADICULOPATHY   . DEGENERATIVE JOINT DISEASE, ANKLE   . DEPRESSION   . GASTROENTERITIS/COLITIS D/T RADIATION   . GERD   . GLUCOSE INTOLERANCE   . IBS   . MALNUTRITION   . MENOPAUSAL DISORDER   . OSTEOPOROSIS   . Peripheral neuropathy ([place]) 12/02/2011  . Hyperlipidemia 05/18/2012  . B12 deficiency 05/08/2014  . Cervical cancer ([place])   . Abnormal serum protein electrophoresis 05/20/2014  . Elevated antinuclear antibody (ANA) level 06/03/2014    Medications:   (Not in a hospital admission)  Assessment: 57 yoF presenting with weakness and failure to thrive. Found to have new onset afib ([place] - 3). Will be initiated on heparin gtt.  Hgb 12, Plt 246, CrCl < 20 mL/min  Goal of Therapy:  Heparin level 0.3-0.7 units/ml Monitor platelets by anticoagulation protocol: Yes   Plan:  Give 2600 units bolus x 1 Start heparin infusion at 750 units/hr Check anti-Xa level in 8 hours and daily while on heparin Continue to monitor H&H and platelets with daily CBC F/U plans for long term [place], PharmD Clinical Pharmacy Resident Pager: [phone] 07/02/2015,5:52 PM

## 2015-07-02 NOTE — Consult Note (Signed)
CARDIOLOGY CONSULT NOTE   Patient ID: Oluwaseun Bruyere MRN: [id number] DOB/AGE: October 29, 1924 81 y.o.  Admit date: 07/02/2015  Primary Physician   Cathlean Cower, MD Primary Cardiologist  New Reason for Consultation  Afib with RVR  HPI: Kesha Hurrell is a 81 y.o. female with a history of upper tension, hyperlipidemia, IBS, malnutrition AND for primary care provider for 3 week history of fatigue and A. Fib.  No prior history of cardiac disease. Never seen by any cardiologist. She used to live  independently prior to 3 weeks ago when she developed  fatigue and weakness.  She lost 17 pounds in 3 weeks. Family took her to PCP today, where her heart rate was elevated and she is sent to ED for further evaluation. She denies any chest pain, shortness of breath, lower extremity edema, orthopnea, PND, melena, blood in her stool, dizziness, nausea or vomiting.   In ED she was found to have A. fib with RVR at rate of 146.  Scr of 1.64. Lactic acid 1.91-->2.78. CXR with out acute abnormality. CT of head No acute intracranial abnormality. Atrophy, chronic small vessel disease.  Unable to provide any family history of heart disease.   Past Medical History  Diagnosis Date  . ALLERGIC RHINITIS   . ANEMIA-NOS   . ANXIETY   . CERVICAL CANCER, HX OF   . DEGENERATIVE DISC DISEASE, LUMBOSACRAL SPINE W/RADICULOPATHY   . DEGENERATIVE JOINT DISEASE, ANKLE   . DEPRESSION   . GASTROENTERITIS/COLITIS D/T RADIATION   . GERD   . GLUCOSE INTOLERANCE   . IBS   . MALNUTRITION   . MENOPAUSAL DISORDER   . OSTEOPOROSIS   . Peripheral neuropathy ([place]) 12/02/2011  . Hyperlipidemia 05/18/2012  . B12 deficiency 05/08/2014  . Cervical cancer ([place])   . Abnormal serum protein electrophoresis 05/20/2014  . Elevated antinuclear antibody (ANA) level 06/03/2014     Past Surgical History  Procedure Laterality Date  . Abdominal hysterectomy    . (l) hip fracture    . S/p sb resection and stricturoplasty  09/2000  .  Appendectomy    . Oophorectomy      No Known Allergies  I have reviewed the patient's current medications . sodium chloride   Intravenous STAT  . feeding supplement  1 Container Oral TID BM  . heparin  2,600 Units Intravenous Once  . sodium chloride  1,000 mL Intravenous Once   . diltiazem (CARDIZEM) infusion 10 mg/hr (07/02/15 1719)  . heparin       Prior to Admission medications   Medication Sig Start Date End Date Taking? Authorizing Provider  acetaminophen (TYLENOL) 500 MG tablet Take 500-1,000 mg by mouth every 6 (six) hours as needed for mild pain, moderate pain or headache.   Yes Historical Provider, MD  amLODipine (NORVASC) 5 MG tablet Take 1 tablet (5 mg total) by mouth daily. 10/30/14 10/30/15 Yes Biagio Borg, MD  gabapentin (NEURONTIN) 300 MG capsule TAKE 1 CAPSULE BY MOUTH TWICE A DAY AND 2 CAPSULES AT BEDTIME AS DIRECTED 06/16/15  Yes Biagio Borg, MD  HYDROcodone-acetaminophen (NORCO/VICODIN) 5-325 MG per tablet Take 0.5 tablets by mouth daily as needed for moderate pain or severe pain.   Yes Historical Provider, MD  LORazepam (ATIVAN) 0.5 MG tablet TAKE 1 TABLET BY MOUTH TWICE A DAY AS NEEDED FOR ANXIETY Patient taking differently: TAKE 0.5 MG BY MOUTH TWICE DAILY AS NEEDED FOR ANXIETY 05/20/15  Yes Biagio Borg, MD  Multiple Vitamins-Minerals (MULTIVITAMIN ADULTS 50+ PO) Take 1  tablet by mouth daily.   Yes Historical Provider, MD  omeprazole (PRILOSEC) 20 MG capsule TAKE 2 CAPSULES BY MOUTH EVERY DAY Patient taking differently: Take 20 mg by mouth 2 (two) times daily as needed (heartburn, acid reflux). TAKE 2 CAPSULES BY MOUTH EVERY DAY 05/08/14  Yes Biagio Borg, MD  sertraline (ZOLOFT) 100 MG tablet TAKE 1 TABLET BY MOUTH ONCE EVERY MORNING WITH FOOD Patient taking differently: Take 100 mg by mouth daily.  10/30/14  Yes Biagio Borg, MD  traMADol (ULTRAM) 50 MG tablet TAKE 1 TABLET BY MOUTH 3 TIMES A DAY AS NEEDED Patient taking differently: TAKE 50 MG BY MOUTH TWICE DAILY  AS NEEDED FOR PAIN 04/22/15  Yes Biagio Borg, MD  vitamin E 400 UNIT capsule Take 400 Units by mouth daily.   Yes Historical Provider, MD       Family History  Problem Relation Age of Onset  . Lung cancer Daughter      ROS:  Full 14 point review of systems complete and found to be negative unless listed above.  Physical Exam: Blood pressure 125/98, pulse 139, temperature 99.8 F (37.7 C), temperature source Rectal, resp. rate 19, height 5' 1.81" (1.57 m), SpO2 96 %.  General: Well developed, well nourished, female in no acute distress Head: Eyes PERRLA, No xanthomas. Normocephalic and atraumatic, oropharynx without edema or exudate.  Lungs: Resp regular and unlabored, CTA. Heart: IR IR with tachy no s3, s4, or murmurs..   Neck: No carotid bruits. No lymphadenopathy. +  JVD. Abdomen: Bowel sounds present, abdomen soft and non-tender without masses or hernias noted. Msk:  No spine or cva tenderness. No weakness, no joint deformities or effusions. Extremities: No clubbing, cyanosis or edema. DP/PT/Radials 1+ and equal bilaterally. Neuro: Alert and oriented X 3. No focal deficits noted. Psych:  Good affect, responds appropriately Skin: No rashes or lesions noted.  Labs:   Lab Results  Component Value Date   WBC 15.8* 07/02/2015   HGB 12.0 07/02/2015   HCT 39.0 07/02/2015   MCV 87.1 07/02/2015   PLT 246 07/02/2015   No results for input(s): INR in the last 72 hours.   Recent Labs Lab 07/02/15 1315  NA 135  K 3.7  CL 97*  CO2 23  BUN 18  CREATININE 1.64*  CALCIUM 8.7*  GLUCOSE 99   No results found for: MG  Recent Labs  07/02/15 1315  CKTOTAL 37*  TROPONINI <0.03    Echo: Pending  ECG:   Vent. rate 146 BPM PR interval * ms QRS duration 77 ms QT/QTc 281/438 ms P-R-T axes -1 -35 190  Radiology:  Dg Chest 2 View  07/02/2015  CLINICAL DATA:  Weakness and malaise ; atrial fibrillation EXAM: CHEST  2 VIEW COMPARISON:  June 03, 2015 FINDINGS: There is no  edema or consolidation. There is slight scarring in the bases. Heart is upper normal in size with pulmonary vascularity within normal limits. There is calcification in the mitral annulus. There is a moderate-sized hiatal hernia. No adenopathy. Bones are diffusely osteoporotic. There is stable anterior wedging of the T12 and L1 vertebral bodies. IMPRESSION: No edema or consolidation. Bones osteoporotic. Heart is upper normal in size. There is a moderate hiatal hernia. There is extensive mitral annulus calcification. Electronically Signed   By: Lowella Grip III M.D.   On: 07/02/2015 13:40   Ct Head Wo Contrast  07/02/2015  CLINICAL DATA:  Failure to thrive.  Fall, fatigue, hallucinations. EXAM: CT HEAD WITHOUT  CONTRAST CT CERVICAL SPINE WITHOUT CONTRAST TECHNIQUE: Multidetector CT imaging of the head and cervical spine was performed following the standard protocol without intravenous contrast. Multiplanar CT image reconstructions of the cervical spine were also generated. COMPARISON:  None. FINDINGS: CT HEAD FINDINGS There is atrophy and chronic small vessel disease changes. No acute intracranial abnormality. Specifically, no hemorrhage, hydrocephalus, mass lesion, acute infarction, or significant intracranial injury. No acute calvarial abnormality. CT CERVICAL SPINE FINDINGS Normal alignment. Prevertebral soft tissues are normal. Mild diffuse degenerative facet disease bilaterally. Slight disc space narrowing at C5-6 and C6-7. No fracture. No epidural or paraspinal hematoma. IMPRESSION: No acute intracranial abnormality. Atrophy, chronic small vessel disease. No acute bony abnormality in the cervical spine. Electronically Signed   By: Rolm Baptise M.D.   On: 07/02/2015 14:57   Ct Cervical Spine Wo Contrast  07/02/2015  CLINICAL DATA:  Failure to thrive.  Fall, fatigue, hallucinations. EXAM: CT HEAD WITHOUT CONTRAST CT CERVICAL SPINE WITHOUT CONTRAST TECHNIQUE: Multidetector CT imaging of the head and  cervical spine was performed following the standard protocol without intravenous contrast. Multiplanar CT image reconstructions of the cervical spine were also generated. COMPARISON:  None. FINDINGS: CT HEAD FINDINGS There is atrophy and chronic small vessel disease changes. No acute intracranial abnormality. Specifically, no hemorrhage, hydrocephalus, mass lesion, acute infarction, or significant intracranial injury. No acute calvarial abnormality. CT CERVICAL SPINE FINDINGS Normal alignment. Prevertebral soft tissues are normal. Mild diffuse degenerative facet disease bilaterally. Slight disc space narrowing at C5-6 and C6-7. No fracture. No epidural or paraspinal hematoma. IMPRESSION: No acute intracranial abnormality. Atrophy, chronic small vessel disease. No acute bony abnormality in the cervical spine. Electronically Signed   By: Rolm Baptise M.D.   On: 07/02/2015 14:57    ASSESSMENT AND PLAN:    1.  Atrial fibrillation with RVR  - New onset.CHADSVASC score of 4 (age, sex and HTN). Continue heparin for pharmacy for now, transition to [place] after further work up. - Currently on Cardizem gtt at 30ml/hr. BP was soft low earlier. Titrate up as BP allows.  - Pending TSH - Consider bolus of 522ml fluid and then 135ml/hr. She looks dehydrated. No sign of HF.  - Echo once rate is stable.    Otherwise per primary:   Active Problems:   MALNUTRITION   Chronic pain   General weakness   SIRS (systemic inflammatory response syndrome) (HCC)   Severe dehydration   Atrial fibrillation (HCC)   Lactic acidosis   AKI (acute kidney injury) ([place])   HTN (hypertension), benign   Signed: Bhagat,Bhavinkumar, PA 07/02/2015, 6:51 PM   Co-Sign MD   I have seen, examined and evaluated the patient this PM  along with Mr. Curly [place] .  After reviewing all the available data and chart,  I agree with his findings, examination as well as impression recommendations.   Very frail,elderly 81 year old woman patient  is presenting with failure to thrive. Has not been eating and drinking well for the last 3 weeks. She now presents from her primary care office for her failure to thrive symptoms. She was also noted to be in atrial fibrillation with RVR. She does not really note any significant chest tightness or pressure or any sensation of palpitations. She has been lightheaded and woozy, but has not had any syncope or near syncope.  She basically has new onset/new diagnosis of A. Fib. She did not tolerate titrating up the diltiazem drip in the emergency room.  Multiple current issues. This concern of possible systemic inflammatory  response syndrome which would certainly drive her A. Fib rate faster. Need to evaluate TSH. She also has worsening renal function which would suggest probably dehydration. With likely dehydration, she needs aggressive fluid resuscitation. We'll give her a bolus of 500 mL start running normal saline.  This will also allow [place] to increase the rate control. For now continue diltiazem is reasonable, if we're to titrate up from 10-15 mg per hr, I would recommend giving a 10 mg bolus at the time of the titration up. Could also consider adding beta blocker if rate is difficult to control. Would not use digoxin based on worsening renal function.  Starting IV heparin is reasonable for anticoagulation in case the decision is that we need to cardiovert. For now would not start on oral anticoagulant until we know what her nutrition status is.  The main issue here is not the atrial fibrillation, but the patient's underlying issues and conditions that lead her to have the RVR and dehydration and poor nutrition.  For now I would forego checking an echocardiogram until her heart rate has improved. Would eventually consider ischemic evaluation, the knee at this of any significant symptoms of a heart rate in the 130-140 range, I doubt that she has existing CAD.   We will follow along & be happy to be of  any assistance.   Leonie Man, M.D., M.S. Interventional Cardiologist   Pager # [phone]

## 2015-07-02 NOTE — Telephone Encounter (Signed)
States mother is requesting to transfer from Dr. Jenny Reichmann to Dr. Quay Burow.  Please advise.

## 2015-07-02 NOTE — Consult Note (Deleted)
CARDIOLOGY CONSULT NOTE   Patient ID: [name] MRN: [id number] DOB/AGE: 02/19/1925 81 y.o.  Admit date: 07/02/2015  Primary Physician   Cathlean Cower, MD Primary Cardiologist  New Reason for Consultation  Afib with RVR  HPI: [name] is a 81 y.o. female with a history of upper tension, hyperlipidemia, IBS, malnutrition AND for primary care provider for 3 week history of fatigue and A. Fib.  No prior history of cardiac disease. Never seen by any cardiologist. She used to live  independently prior to 3 weeks ago when she developed  fatigue and weakness.  She lost 17 pounds in 3 weeks. Family took her to PCP today, where her heart rate was elevated and she is sent to ED for further evaluation. She denies any chest pain, shortness of breath, lower extremity edema, orthopnea, PND, melena, blood in her stool, dizziness, nausea or vomiting.   In ED she was found to have A. fib with RVR at rate of 146.  Scr of 1.64. Lactic acid 1.91-->2.78. CXR with out acute abnormality. CT of head No acute intracranial abnormality. Atrophy, chronic small vessel disease.  Unable to provide any family history of heart disease.   Past Medical History  Diagnosis Date  . ALLERGIC RHINITIS   . ANEMIA-NOS   . ANXIETY   . CERVICAL CANCER, HX OF   . DEGENERATIVE DISC DISEASE, LUMBOSACRAL SPINE W/RADICULOPATHY   . DEGENERATIVE JOINT DISEASE, ANKLE   . DEPRESSION   . GASTROENTERITIS/COLITIS D/T RADIATION   . GERD   . GLUCOSE INTOLERANCE   . IBS   . MALNUTRITION   . MENOPAUSAL DISORDER   . OSTEOPOROSIS   . Peripheral neuropathy ([place]) 12/02/2011  . Hyperlipidemia 05/18/2012  . B12 deficiency 05/08/2014  . Cervical cancer ([place])   . Abnormal serum protein electrophoresis 05/20/2014  . Elevated antinuclear antibody (ANA) level 06/03/2014     Past Surgical History  Procedure Laterality Date  . Abdominal hysterectomy    . (l) hip fracture    . S/p sb resection and stricturoplasty  09/2000  .  Appendectomy    . Oophorectomy      No Known Allergies  I have reviewed the patient's current medications . feeding supplement  1 Container Oral TID BM  . heparin  2,600 Units Intravenous Once   . sodium chloride    . diltiazem (CARDIZEM) infusion 10 mg/hr (07/02/15 1719)  . heparin    . sodium chloride 1,000 mL (07/02/15 1751)     Prior to Admission medications   Medication Sig Start Date End Date Taking? Authorizing Provider  acetaminophen (TYLENOL) 500 MG tablet Take 500-1,000 mg by mouth every 6 (six) hours as needed for mild pain, moderate pain or headache.   Yes Historical Provider, MD  amLODipine (NORVASC) 5 MG tablet Take 1 tablet (5 mg total) by mouth daily. 10/30/14 10/30/15 Yes Biagio Borg, MD  gabapentin (NEURONTIN) 300 MG capsule TAKE 1 CAPSULE BY MOUTH TWICE A DAY AND 2 CAPSULES AT BEDTIME AS DIRECTED 06/16/15  Yes Biagio Borg, MD  HYDROcodone-acetaminophen (NORCO/VICODIN) 5-325 MG per tablet Take 0.5 tablets by mouth daily as needed for moderate pain or severe pain.   Yes Historical Provider, MD  LORazepam (ATIVAN) 0.5 MG tablet TAKE 1 TABLET BY MOUTH TWICE A DAY AS NEEDED FOR ANXIETY Patient taking differently: TAKE 0.5 MG BY MOUTH TWICE DAILY AS NEEDED FOR ANXIETY 05/20/15  Yes Biagio Borg, MD  Multiple Vitamins-Minerals (MULTIVITAMIN ADULTS 50+ PO) Take 1 tablet by mouth  daily.   Yes Historical Provider, MD  omeprazole (PRILOSEC) 20 MG capsule TAKE 2 CAPSULES BY MOUTH EVERY DAY Patient taking differently: Take 20 mg by mouth 2 (two) times daily as needed (heartburn, acid reflux). TAKE 2 CAPSULES BY MOUTH EVERY DAY 05/08/14  Yes Biagio Borg, MD  sertraline (ZOLOFT) 100 MG tablet TAKE 1 TABLET BY MOUTH ONCE EVERY MORNING WITH FOOD Patient taking differently: Take 100 mg by mouth daily.  10/30/14  Yes Biagio Borg, MD  traMADol (ULTRAM) 50 MG tablet TAKE 1 TABLET BY MOUTH 3 TIMES A DAY AS NEEDED Patient taking differently: TAKE 50 MG BY MOUTH TWICE DAILY AS NEEDED FOR PAIN  04/22/15  Yes Biagio Borg, MD  vitamin E 400 UNIT capsule Take 400 Units by mouth daily.   Yes Historical Provider, MD       Family History  Problem Relation Age of Onset  . Lung cancer Daughter      ROS:  Full 14 point review of systems complete and found to be negative unless listed above.  Physical Exam: Blood pressure 125/98, pulse 139, temperature 99.8 F (37.7 C), temperature source Rectal, resp. rate 19, height 5' 1.81" (1.57 m), SpO2 96 %.  General: Well developed, well nourished, female in no acute distress Head: Eyes PERRLA, No xanthomas. Normocephalic and atraumatic, oropharynx without edema or exudate.  Lungs: Resp regular and unlabored, CTA. Heart: IR IR with tachy no s3, s4, or murmurs..   Neck: No carotid bruits. No lymphadenopathy. +  JVD. Abdomen: Bowel sounds present, abdomen soft and non-tender without masses or hernias noted. Msk:  No spine or cva tenderness. No weakness, no joint deformities or effusions. Extremities: No clubbing, cyanosis or edema. DP/PT/Radials 1+ and equal bilaterally. Neuro: Alert and oriented X 3. No focal deficits noted. Psych:  Good affect, responds appropriately Skin: No rashes or lesions noted.  Labs:   Lab Results  Component Value Date   WBC 15.8* 07/02/2015   HGB 12.0 07/02/2015   HCT 39.0 07/02/2015   MCV 87.1 07/02/2015   PLT 246 07/02/2015   No results for input(s): INR in the last 72 hours.  Recent Labs Lab 07/02/15 1315  NA 135  K 3.7  CL 97*  CO2 23  BUN 18  CREATININE 1.64*  CALCIUM 8.7*  GLUCOSE 99   No results found for: MG  Recent Labs  07/02/15 1315  CKTOTAL 37*  TROPONINI <0.03    Echo: Pending  ECG:   Vent. rate 146 BPM PR interval * ms QRS duration 77 ms QT/QTc 281/438 ms P-R-T axes -1 -35 190  Radiology:  Dg Chest 2 View  07/02/2015  CLINICAL DATA:  Weakness and malaise ; atrial fibrillation EXAM: CHEST  2 VIEW COMPARISON:  June 03, 2015 FINDINGS: There is no edema or  consolidation. There is slight scarring in the bases. Heart is upper normal in size with pulmonary vascularity within normal limits. There is calcification in the mitral annulus. There is a moderate-sized hiatal hernia. No adenopathy. Bones are diffusely osteoporotic. There is stable anterior wedging of the T12 and L1 vertebral bodies. IMPRESSION: No edema or consolidation. Bones osteoporotic. Heart is upper normal in size. There is a moderate hiatal hernia. There is extensive mitral annulus calcification. Electronically Signed   By: Lowella Grip III M.D.   On: 07/02/2015 13:40   Ct Head Wo Contrast  07/02/2015  CLINICAL DATA:  Failure to thrive.  Fall, fatigue, hallucinations. EXAM: CT HEAD WITHOUT CONTRAST CT CERVICAL SPINE  WITHOUT CONTRAST TECHNIQUE: Multidetector CT imaging of the head and cervical spine was performed following the standard protocol without intravenous contrast. Multiplanar CT image reconstructions of the cervical spine were also generated. COMPARISON:  None. FINDINGS: CT HEAD FINDINGS There is atrophy and chronic small vessel disease changes. No acute intracranial abnormality. Specifically, no hemorrhage, hydrocephalus, mass lesion, acute infarction, or significant intracranial injury. No acute calvarial abnormality. CT CERVICAL SPINE FINDINGS Normal alignment. Prevertebral soft tissues are normal. Mild diffuse degenerative facet disease bilaterally. Slight disc space narrowing at C5-6 and C6-7. No fracture. No epidural or paraspinal hematoma. IMPRESSION: No acute intracranial abnormality. Atrophy, chronic small vessel disease. No acute bony abnormality in the cervical spine. Electronically Signed   By: Rolm Baptise M.D.   On: 07/02/2015 14:57   Ct Cervical Spine Wo Contrast  07/02/2015  CLINICAL DATA:  Failure to thrive.  Fall, fatigue, hallucinations. EXAM: CT HEAD WITHOUT CONTRAST CT CERVICAL SPINE WITHOUT CONTRAST TECHNIQUE: Multidetector CT imaging of the head and cervical  spine was performed following the standard protocol without intravenous contrast. Multiplanar CT image reconstructions of the cervical spine were also generated. COMPARISON:  None. FINDINGS: CT HEAD FINDINGS There is atrophy and chronic small vessel disease changes. No acute intracranial abnormality. Specifically, no hemorrhage, hydrocephalus, mass lesion, acute infarction, or significant intracranial injury. No acute calvarial abnormality. CT CERVICAL SPINE FINDINGS Normal alignment. Prevertebral soft tissues are normal. Mild diffuse degenerative facet disease bilaterally. Slight disc space narrowing at C5-6 and C6-7. No fracture. No epidural or paraspinal hematoma. IMPRESSION: No acute intracranial abnormality. Atrophy, chronic small vessel disease. No acute bony abnormality in the cervical spine. Electronically Signed   By: Rolm Baptise M.D.   On: 07/02/2015 14:57    ASSESSMENT AND PLAN:    1.  Atrial fibrillation with RVR  - New onset.CHADSVASC score of 4 (age, sex and HTN). Continue heparin for pharmacy for now, transition to [place] after further work up. - Currently on Cardizem gtt at 37ml/hr.  - Pending TSH - Consider bolus of 541ml fluid and then 178ml/hr. She looks dehydrated. No sign of HF.  - Echo once rate is stable.    Otherwise per primary:   Active Problems:   MALNUTRITION   Chronic pain   General weakness   SIRS (systemic inflammatory response syndrome) (HCC)   Severe dehydration   Atrial fibrillation (HCC)   Lactic acidosis   AKI (acute kidney injury) ([place])   HTN (hypertension), benign   Signed: Trayshawn Durkin, PA 07/02/2015, 6:29 PM   Co-Sign MD

## 2015-07-02 NOTE — ED Provider Notes (Signed)
CSN: [id number]     Arrival date & time 07/02/15  1245 History   First MD Initiated Contact with Patient 07/02/15 1253     Chief Complaint  Patient presents with  . Atrial Fibrillation  . Failure To Thrive     (Consider location/radiation/quality/duration/timing/severity/associated sxs/prior Treatment) HPI Comments:  Patient presents from primary care office with generalized weakness, not eating, not drinking, not walking for the past several weeks. Found to be in atrial fibrillation which is new. Denies any fever but his temp is 100.5 on arrival. She is oriented to self and place. She's been independent living and not been able to take care of herself for the past several days. She was sent to the ED by EMS. She denies any productive cough, abdominal pain, nausea or vomiting. Has a history of frequent UTIs. She did have a fall yesterday with questionable LOC.   The history is provided by the patient, the EMS personnel and a relative. The history is limited by the condition of the patient.    Past Medical History  Diagnosis Date  . ALLERGIC RHINITIS   . ANEMIA-NOS   . ANXIETY   . CERVICAL CANCER, HX OF   . DEGENERATIVE DISC DISEASE, LUMBOSACRAL SPINE W/RADICULOPATHY   . DEGENERATIVE JOINT DISEASE, ANKLE   . DEPRESSION   . GASTROENTERITIS/COLITIS D/T RADIATION   . GERD   . GLUCOSE INTOLERANCE   . IBS   . MALNUTRITION   . MENOPAUSAL DISORDER   . OSTEOPOROSIS   . Peripheral neuropathy ([place]) 12/02/2011  . Hyperlipidemia 05/18/2012  . B12 deficiency 05/08/2014  . Cervical cancer ([place])   . Abnormal serum protein electrophoresis 05/20/2014  . Elevated antinuclear antibody (ANA) level 06/03/2014   Past Surgical History  Procedure Laterality Date  . Abdominal hysterectomy    . (l) hip fracture    . S/p sb resection and stricturoplasty  09/2000  . Appendectomy    . Oophorectomy     Family History  Problem Relation Age of Onset  . Lung cancer Daughter    Social History  Substance  Use Topics  . Smoking status: Never Smoker   . Smokeless tobacco: Never Used  . Alcohol Use: No   OB History    No data available     Review of Systems  Constitutional: Positive for fever, activity change, appetite change and fatigue.  HENT: Negative for congestion and nosebleeds.   Respiratory: Negative for cough and chest tightness.   Cardiovascular: Negative for chest pain.  Gastrointestinal: Negative for nausea, vomiting and abdominal pain.  Genitourinary: Negative for dysuria, hematuria, vaginal bleeding and vaginal discharge.  Musculoskeletal: Positive for myalgias and arthralgias.  Skin: Negative for rash.  Neurological: Negative for dizziness, weakness and headaches.  A complete 10 system review of systems was obtained and all systems are negative except as noted in the HPI and PMH.      Allergies  Review of patient's allergies indicates no known allergies.  Home Medications   Prior to Admission medications   Medication Sig Start Date End Date Taking? Authorizing Provider  acetaminophen (TYLENOL) 500 MG tablet Take 500-1,000 mg by mouth every 6 (six) hours as needed for mild pain, moderate pain or headache.   Yes Historical Provider, MD  amLODipine (NORVASC) 5 MG tablet Take 1 tablet (5 mg total) by mouth daily. 10/30/14 10/30/15 Yes Biagio Borg, MD  gabapentin (NEURONTIN) 300 MG capsule TAKE 1 CAPSULE BY MOUTH TWICE A DAY AND 2 CAPSULES AT BEDTIME AS DIRECTED 06/16/15  Yes Biagio Borg, MD  HYDROcodone-acetaminophen (NORCO/VICODIN) 5-325 MG per tablet Take 0.5 tablets by mouth daily as needed for moderate pain or severe pain.   Yes Historical Provider, MD  LORazepam (ATIVAN) 0.5 MG tablet TAKE 1 TABLET BY MOUTH TWICE A DAY AS NEEDED FOR ANXIETY Patient taking differently: TAKE 0.5 MG BY MOUTH TWICE DAILY AS NEEDED FOR ANXIETY 05/20/15  Yes Biagio Borg, MD  Multiple Vitamins-Minerals (MULTIVITAMIN ADULTS 50+ PO) Take 1 tablet by mouth daily.   Yes Historical Provider, MD   omeprazole (PRILOSEC) 20 MG capsule TAKE 2 CAPSULES BY MOUTH EVERY DAY Patient taking differently: Take 20 mg by mouth 2 (two) times daily as needed (heartburn, acid reflux). TAKE 2 CAPSULES BY MOUTH EVERY DAY 05/08/14  Yes Biagio Borg, MD  sertraline (ZOLOFT) 100 MG tablet TAKE 1 TABLET BY MOUTH ONCE EVERY MORNING WITH FOOD Patient taking differently: Take 100 mg by mouth daily.  10/30/14  Yes Biagio Borg, MD  traMADol (ULTRAM) 50 MG tablet TAKE 1 TABLET BY MOUTH 3 TIMES A DAY AS NEEDED Patient taking differently: TAKE 50 MG BY MOUTH TWICE DAILY AS NEEDED FOR PAIN 04/22/15  Yes Biagio Borg, MD  vitamin E 400 UNIT capsule Take 400 Units by mouth daily.   Yes Historical Provider, MD   BP 125/98 mmHg  Pulse 139  Temp(Src) 99.8 F (37.7 C) (Rectal)  Resp 19  Ht 5' 1.81" (1.57 m)  SpO2 96% Physical Exam  Constitutional: She is oriented to person, place, and time. She appears well-developed and well-nourished. No distress.  Chronically ill appearing  HENT:  Head: Normocephalic and atraumatic.  Mouth/Throat: Oropharynx is clear and moist. No oropharyngeal exudate.  Dry mucus membranes, erythematous tongue and lips  Eyes: Conjunctivae and EOM are normal. Pupils are equal, round, and reactive to light.  Neck: Normal range of motion. Neck supple.  Cardiovascular: Normal rate and normal heart sounds.   No murmur heard. Irregular rhythm  Pulmonary/Chest: Effort normal and breath sounds normal. No respiratory distress. She exhibits no tenderness.  Abdominal: Soft. There is no tenderness. There is no rebound and no guarding.  Musculoskeletal: Normal range of motion. She exhibits no edema or tenderness.  Neurological: She is alert and oriented to person, place, and time. No cranial nerve deficit.  Moving all extremities.  Skin: Skin is warm.    ED Course  Procedures (including critical care time) Labs Review Labs Reviewed  BASIC METABOLIC PANEL - Abnormal; Notable for the following:     Chloride 97 (*)    Creatinine, Ser 1.64 (*)    Calcium 8.7 (*)    GFR calc non Af Amer 27 (*)    GFR calc Af Amer 31 (*)    All other components within normal limits  CBC - Abnormal; Notable for the following:    WBC 15.8 (*)    All other components within normal limits  URINALYSIS, ROUTINE W REFLEX MICROSCOPIC (NOT AT [facility]) - Abnormal; Notable for the following:    Color, Urine AMBER (*)    Bilirubin Urine SMALL (*)    Ketones, ur 15 (*)    All other components within normal limits  CK - Abnormal; Notable for the following:    Total CK 37 (*)    All other components within normal limits  I-STAT CG4 LACTIC ACID, ED - Abnormal; Notable for the following:    Lactic Acid, Venous 2.78 (*)    All other components within normal limits  RAPID STREP  SCREEN (NOT AT [facility])  CULTURE, BLOOD (ROUTINE X 2)  CULTURE, BLOOD (ROUTINE X 2)  CULTURE, GROUP A STREP  TROPONIN I  HEPARIN LEVEL (UNFRACTIONATED)  CBC  I-STAT CG4 LACTIC ACID, ED    Imaging Review Dg Chest 2 View  07/02/2015  CLINICAL DATA:  Weakness and malaise ; atrial fibrillation EXAM: CHEST  2 VIEW COMPARISON:  June 03, 2015 FINDINGS: There is no edema or consolidation. There is slight scarring in the bases. Heart is upper normal in size with pulmonary vascularity within normal limits. There is calcification in the mitral annulus. There is a moderate-sized hiatal hernia. No adenopathy. Bones are diffusely osteoporotic. There is stable anterior wedging of the T12 and L1 vertebral bodies. IMPRESSION: No edema or consolidation. Bones osteoporotic. Heart is upper normal in size. There is a moderate hiatal hernia. There is extensive mitral annulus calcification. Electronically Signed   By: Lowella Grip III M.D.   On: 07/02/2015 13:40   Ct Head Wo Contrast  07/02/2015  CLINICAL DATA:  Failure to thrive.  Fall, fatigue, hallucinations. EXAM: CT HEAD WITHOUT CONTRAST CT CERVICAL SPINE WITHOUT CONTRAST TECHNIQUE: Multidetector CT  imaging of the head and cervical spine was performed following the standard protocol without intravenous contrast. Multiplanar CT image reconstructions of the cervical spine were also generated. COMPARISON:  None. FINDINGS: CT HEAD FINDINGS There is atrophy and chronic small vessel disease changes. No acute intracranial abnormality. Specifically, no hemorrhage, hydrocephalus, mass lesion, acute infarction, or significant intracranial injury. No acute calvarial abnormality. CT CERVICAL SPINE FINDINGS Normal alignment. Prevertebral soft tissues are normal. Mild diffuse degenerative facet disease bilaterally. Slight disc space narrowing at C5-6 and C6-7. No fracture. No epidural or paraspinal hematoma. IMPRESSION: No acute intracranial abnormality. Atrophy, chronic small vessel disease. No acute bony abnormality in the cervical spine. Electronically Signed   By: Rolm Baptise M.D.   On: 07/02/2015 14:57   Ct Cervical Spine Wo Contrast  07/02/2015  CLINICAL DATA:  Failure to thrive.  Fall, fatigue, hallucinations. EXAM: CT HEAD WITHOUT CONTRAST CT CERVICAL SPINE WITHOUT CONTRAST TECHNIQUE: Multidetector CT imaging of the head and cervical spine was performed following the standard protocol without intravenous contrast. Multiplanar CT image reconstructions of the cervical spine were also generated. COMPARISON:  None. FINDINGS: CT HEAD FINDINGS There is atrophy and chronic small vessel disease changes. No acute intracranial abnormality. Specifically, no hemorrhage, hydrocephalus, mass lesion, acute infarction, or significant intracranial injury. No acute calvarial abnormality. CT CERVICAL SPINE FINDINGS Normal alignment. Prevertebral soft tissues are normal. Mild diffuse degenerative facet disease bilaterally. Slight disc space narrowing at C5-6 and C6-7. No fracture. No epidural or paraspinal hematoma. IMPRESSION: No acute intracranial abnormality. Atrophy, chronic small vessel disease. No acute bony abnormality in  the cervical spine. Electronically Signed   By: Rolm Baptise M.D.   On: 07/02/2015 14:57   I have personally reviewed and evaluated these images and lab results as part of my medical decision-making.   EKG Interpretation   Date/Time:  Wednesday July 02 2015 15:20:17 EDT Ventricular Rate:  146 PR Interval:    QRS Duration: 77 QT Interval:  281 QTC Calculation: 438 R Axis:   -35 Text Interpretation:  Atrial fibrillation with rapid V-rate Ventricular  premature complex Left axis deviation Low voltage, precordial leads  Repolarization abnormality, prob rate related Rate faster Confirmed by  Deyani Hegarty  MD, Gini Caputo ([id number]) on 07/02/2015 3:45:08 PM      MDM   Final diagnoses:  Atrial fibrillation with RVR (HCC)  Failure to  thrive in adult   Failure to thrive with decreased appetite, decreased urine output, from PCP's office with new afib.  C/o head and neck pain after fall yesterday.   Controlled A fib on arrival. No CP or SOB. IVF started for clinical dehydration. Cr elevated.  UA neg.  CXR negative.  No evidence of infection.  Hold antibiotics at this time.  HR increased to 130-140s with atrial fibrillation and Cardizem gtt ordered.  Additional IVF.  BP and mental status stable.  Admission d/w Dr. Tamala Julian.  CRITICAL CARE Performed by: Ezequiel Essex Total critical care time: 35 Critical care time was exclusive of separately billable procedures and treating other patients. Critical care was necessary to treat or prevent imminent or life-threatening deterioration. Critical care was time spent personally by me on the following activities: development of treatment plan with patient and/or surrogate as well as nursing, discussions with consultants, evaluation of patient's response to treatment, examination of patient, obtaining history from patient or surrogate, ordering and performing treatments and interventions, ordering and review of laboratory studies, ordering and review of  radiographic studies, pulse oximetry and re-evaluation of patient's condition.    Ezequiel Essex, MD 07/02/15 [phone]

## 2015-07-02 NOTE — H&P (Signed)
[facility] History and Physical  [name] YDX:[id number] DOB: 1924/10/26 DOA: 07/02/2015  Referring physician: ED PCP: Cathlean Cower, MD   Chief Complaint: Confusion, generalized weakness, and lack of appetite  HPI: Patient is a 81 year old, Caucasian female with past medical history of cervical cancer status post hysterectomy, chronic pain who presents for generalized weakness and lack of appetite that have progressively worsened in the last 3 weeks. Family is present at bedside and provided additional history. The patient was able to live independently prior to these symptoms starting, however when she became unable to stand/walk on her own that's why they brought her into the hospital for evaluation. Family notes in the last 3 weeks that her weight has dropped from around 130 pounds to approximately 113 pounds. They had tried to supplement her meals with Carnation chocolate shakes, but the patient would only take a few sips. She notes that she has no associated symptoms of feeling thirsty and palpitations. Upon arrival to the emergency department she was seen to be in Afib with initial heart rates in the 110's.  The heart rates increased to 140s 150s and a Cardizem drip was started. She denies having any heart issues are complications in the past. Patient denies any chest pain or shortness of breath. She notes pain all over her body and not isolate any specific region. This appears to be her baseline pain in which she lives with. Family also notes all alert she is been somewhat generally confused from time to time with these symptoms. He also reported that she had a couple falls where she likely just slid out of bed. Family states that she had no head trauma or loss of consciousness. Denies any nausea vomiting symptoms.    Review of Systems: negative for the following  Constitutional: Positive for decreased appetite, weight loss, fatigue fatigue. Denies fever, chills. HEENT: Positive for  Dry mouth. Denies photophobia, eye pain, redness, hearing loss, ear pain, congestion, sore throat, rhinorrhea, sneezing, mouth sores, trouble swallowing, neck pain, neck stiffness and tinnitus.  Respiratory: Denies SOB, DOE, cough, chest tightness, and wheezing.  Cardiovascular: Positive for palpitations. Denies chest pain and leg swelling.  Gastrointestinal: Denies nausea, vomiting, abdominal pain, diarrhea, constipation, blood in stool and abdominal distention.  Genitourinary: Denies dysuria, urgency, frequency, hematuria, flank pain and difficulty urinating.  Musculoskeletal: Positive for generalized pain and inability to walk due to leg weakness. Denies myalgias, back pain, joint swelling, arthralgias  Skin: Denies pallor, rash and wound.  Neurological: Denies dizziness, seizures, syncope, weakness, light-headedness, numbness and headaches.  Hematological: Positive for easy easy bruising. Denies adenopathy.  personal or family bleeding history  Psychiatric/Behavioral: Positive for confusion. Denies suicidal ideation, mood changes, , nervousness, sleep disturbance and agitation       Past Medical History  Diagnosis Date  . ALLERGIC RHINITIS   . ANEMIA-NOS   . ANXIETY   . CERVICAL CANCER, HX OF   . DEGENERATIVE DISC DISEASE, LUMBOSACRAL SPINE W/RADICULOPATHY   . DEGENERATIVE JOINT DISEASE, ANKLE   . DEPRESSION   . GASTROENTERITIS/COLITIS D/T RADIATION   . GERD   . GLUCOSE INTOLERANCE   . IBS   . MALNUTRITION   . MENOPAUSAL DISORDER   . OSTEOPOROSIS   . Peripheral neuropathy ([place]) 12/02/2011  . Hyperlipidemia 05/18/2012  . B12 deficiency 05/08/2014  . Cervical cancer ([place])   . Abnormal serum protein electrophoresis 05/20/2014  . Elevated antinuclear antibody (ANA) level 06/03/2014     Past Surgical History  Procedure Laterality Date  . Abdominal  hysterectomy    . (l) hip fracture    . S/p sb resection and stricturoplasty  09/2000  . Appendectomy    . Oophorectomy         Social History:  reports that she has never smoked. She has never used smokeless tobacco. She reports that she does not drink alcohol or use illicit drugs. Where does patient live--home independently prior to onset of symptoms  Can patient participate in ADLs? Not at this time as unable to ambulate without assistance.  No Known Allergies  Family History  Problem Relation Age of Onset  . Lung cancer Daughter       FAMILY HISTORY  When questioned  Directly-patient reports  No family history of HTN, CVA ,DIABETES, TB, Cancer CAD, Bleeding Disorders, Sickle Cell, diabetes, anemia, asthma,   Prior to Admission medications   Medication Sig Start Date End Date Taking? Authorizing Provider  acetaminophen (TYLENOL) 500 MG tablet Take 500-1,000 mg by mouth every 6 (six) hours as needed for mild pain, moderate pain or headache.   Yes Historical Provider, MD  amLODipine (NORVASC) 5 MG tablet Take 1 tablet (5 mg total) by mouth daily. 10/30/14 10/30/15 Yes Biagio Borg, MD  gabapentin (NEURONTIN) 300 MG capsule TAKE 1 CAPSULE BY MOUTH TWICE A DAY AND 2 CAPSULES AT BEDTIME AS DIRECTED 06/16/15  Yes Biagio Borg, MD  HYDROcodone-acetaminophen (NORCO/VICODIN) 5-325 MG per tablet Take 0.5 tablets by mouth daily as needed for moderate pain or severe pain.   Yes Historical Provider, MD  LORazepam (ATIVAN) 0.5 MG tablet TAKE 1 TABLET BY MOUTH TWICE A DAY AS NEEDED FOR ANXIETY Patient taking differently: TAKE 0.5 MG BY MOUTH TWICE DAILY AS NEEDED FOR ANXIETY 05/20/15  Yes Biagio Borg, MD  Multiple Vitamins-Minerals (MULTIVITAMIN ADULTS 50+ PO) Take 1 tablet by mouth daily.   Yes Historical Provider, MD  omeprazole (PRILOSEC) 20 MG capsule TAKE 2 CAPSULES BY MOUTH EVERY DAY Patient taking differently: Take 20 mg by mouth 2 (two) times daily as needed (heartburn, acid reflux). TAKE 2 CAPSULES BY MOUTH EVERY DAY 05/08/14  Yes Biagio Borg, MD  sertraline (ZOLOFT) 100 MG tablet TAKE 1 TABLET BY MOUTH ONCE EVERY  MORNING WITH FOOD Patient taking differently: Take 100 mg by mouth daily.  10/30/14  Yes Biagio Borg, MD  traMADol (ULTRAM) 50 MG tablet TAKE 1 TABLET BY MOUTH 3 TIMES A DAY AS NEEDED Patient taking differently: TAKE 50 MG BY MOUTH TWICE DAILY AS NEEDED FOR PAIN 04/22/15  Yes Biagio Borg, MD  vitamin E 400 UNIT capsule Take 400 Units by mouth daily.   Yes Historical Provider, MD     Physical Exam: Filed Vitals:   07/02/15 1730 07/02/15 1754 07/02/15 1755 07/02/15 1800  BP: 96/61 125/98    Pulse:      Temp:      TempSrc:      Resp:   14 19  Height:      SpO2:    96%     Constitutional: Vital signs reviewed. Patient is a well-developed and well-nourished in no acute distress and cooperative with exam. Alert and oriented to person and place. Head: Normocephalic and atraumatic  Ear: TM normal bilaterally  Mouth: no erythema or exudates, dry mucous membranes Eyes: PERRL, EOMI, conjunctivae normal, No scleral icterus.  Neck: Supple, Trachea midline normal ROM, No JVD, mass, thyromegaly, or carotid bruit present.  Cardiovascular: Irregular regular heart rhythm. Pulmonary/Chest: CTAB, no wheezes, rales, or rhonchi  Abdominal: Soft.  Non-tender, non-distended, bowel sounds are normal, no masses, organomegaly, or guarding present.  GU: no CVA tenderness Musculoskeletal: No joint deformities, erythema, or stiffness, ROM full and no nontender Ext: no edema and no cyanosis, pulses palpable bilaterally (DP and PT)  Hematology: no cervical, inginal, or axillary adenopathy.  Neurological: A&O x2, Strenght is normal and symmetric bilaterally, cranial nerve II-XII are grossly intact, no focal motor deficit, sensory intact to light touch bilaterally.  Skin: Warm, dry and intact. No rash, cyanosis, or clubbing. Poor skin turgor  Psychiatric: Normal mood and affect. speech and behavior is normal. Judgment and thought content normal. Cognition slightly altered.     Data Review   Micro  Results Recent Results (from the past 240 hour(s))  Rapid strep screen     Status: None   Collection Time: 07/02/15  3:19 PM  Result Value Ref Range Status   Streptococcus, Group A Screen (Direct) NEGATIVE NEGATIVE Final    Comment: (NOTE) A Rapid Antigen test may result negative if the antigen level in the sample is below the detection level of this test. The FDA has not cleared this test as a stand-alone test therefore the rapid antigen negative result has reflexed to a Group A Strep culture.     Radiology Reports Dg Chest 2 View  07/02/2015  CLINICAL DATA:  Weakness and malaise ; atrial fibrillation EXAM: CHEST  2 VIEW COMPARISON:  June 03, 2015 FINDINGS: There is no edema or consolidation. There is slight scarring in the bases. Heart is upper normal in size with pulmonary vascularity within normal limits. There is calcification in the mitral annulus. There is a moderate-sized hiatal hernia. No adenopathy. Bones are diffusely osteoporotic. There is stable anterior wedging of the T12 and L1 vertebral bodies. IMPRESSION: No edema or consolidation. Bones osteoporotic. Heart is upper normal in size. There is a moderate hiatal hernia. There is extensive mitral annulus calcification. Electronically Signed   By: Lowella Grip III M.D.   On: 07/02/2015 13:40   Dg Chest 2 View  06/03/2015  CLINICAL DATA:  Chest pain.  Weakness. EXAM: CHEST  2 VIEW COMPARISON:  05/22/2014 FINDINGS: Cardiopericardial silhouette within normal limits. Mediastinal contours normal. Trachea midline. No airspace disease. Small bilateral pleural effusions are present. Dense mitral annular calcification. Monitoring leads project over the chest. IMPRESSION: Small bilateral pleural effusions are new since osseous survey 05/22/2014, suggesting an element of mild CHF. Electronically Signed   By: Dereck Ligas M.D.   On: 06/03/2015 11:17   Ct Head Wo Contrast  07/02/2015  CLINICAL DATA:  Failure to thrive.  Fall,  fatigue, hallucinations. EXAM: CT HEAD WITHOUT CONTRAST CT CERVICAL SPINE WITHOUT CONTRAST TECHNIQUE: Multidetector CT imaging of the head and cervical spine was performed following the standard protocol without intravenous contrast. Multiplanar CT image reconstructions of the cervical spine were also generated. COMPARISON:  None. FINDINGS: CT HEAD FINDINGS There is atrophy and chronic small vessel disease changes. No acute intracranial abnormality. Specifically, no hemorrhage, hydrocephalus, mass lesion, acute infarction, or significant intracranial injury. No acute calvarial abnormality. CT CERVICAL SPINE FINDINGS Normal alignment. Prevertebral soft tissues are normal. Mild diffuse degenerative facet disease bilaterally. Slight disc space narrowing at C5-6 and C6-7. No fracture. No epidural or paraspinal hematoma. IMPRESSION: No acute intracranial abnormality. Atrophy, chronic small vessel disease. No acute bony abnormality in the cervical spine. Electronically Signed   By: Rolm Baptise M.D.   On: 07/02/2015 14:57   Ct Cervical Spine Wo Contrast  07/02/2015  CLINICAL DATA:  Failure  to thrive.  Fall, fatigue, hallucinations. EXAM: CT HEAD WITHOUT CONTRAST CT CERVICAL SPINE WITHOUT CONTRAST TECHNIQUE: Multidetector CT imaging of the head and cervical spine was performed following the standard protocol without intravenous contrast. Multiplanar CT image reconstructions of the cervical spine were also generated. COMPARISON:  None. FINDINGS: CT HEAD FINDINGS There is atrophy and chronic small vessel disease changes. No acute intracranial abnormality. Specifically, no hemorrhage, hydrocephalus, mass lesion, acute infarction, or significant intracranial injury. No acute calvarial abnormality. CT CERVICAL SPINE FINDINGS Normal alignment. Prevertebral soft tissues are normal. Mild diffuse degenerative facet disease bilaterally. Slight disc space narrowing at C5-6 and C6-7. No fracture. No epidural or paraspinal  hematoma. IMPRESSION: No acute intracranial abnormality. Atrophy, chronic small vessel disease. No acute bony abnormality in the cervical spine. Electronically Signed   By: Rolm Baptise M.D.   On: 07/02/2015 14:57     CBC  Recent Labs Lab 07/02/15 1315  WBC 15.8*  HGB 12.0  HCT 39.0  PLT 246  MCV 87.1  MCH 26.8  MCHC 30.8  RDW 15.3    Chemistries   Recent Labs Lab 07/02/15 1315  NA 135  K 3.7  CL 97*  CO2 23  GLUCOSE 99  BUN 18  CREATININE 1.64*  CALCIUM 8.7*   ------------------------------------------------------------------------------------------------------------------ estimated creatinine clearance is 18.6 mL/min (by C-G formula based on Cr of 1.64). ------------------------------------------------------------------------------------------------------------------ No results for input(s): HGBA1C in the last 72 hours. ------------------------------------------------------------------------------------------------------------------ No results for input(s): CHOL, HDL, LDLCALC, TRIG, CHOLHDL, LDLDIRECT in the last 72 hours. ------------------------------------------------------------------------------------------------------------------ No results for input(s): TSH, T4TOTAL, T3FREE, THYROIDAB in the last 72 hours.  Invalid input(s): FREET3 ------------------------------------------------------------------------------------------------------------------ No results for input(s): VITAMINB12, FOLATE, FERRITIN, TIBC, IRON, RETICCTPCT in the last 72 hours.  Coagulation profile No results for input(s): INR, PROTIME in the last 168 hours.  No results for input(s): DDIMER in the last 72 hours.  Cardiac Enzymes  Recent Labs Lab 07/02/15 1315  TROPONINI <0.03   ------------------------------------------------------------------------------------------------------------------ Invalid input(s): POCBNP   CBG: No results for input(s): GLUCAP in the last 168  hours.     EKG: Independently reviewed. Patient in atrial fib with RVR with HR in the 140's.   Assessment/Plan      Atrial fibrillation with RVR ([place])- Acute. No previous history or family of any cardiac issue patient. Patient's heart rates in the 140s to 150s with blood pressure internally maintained. Started patient on a Cardizem drip. Transferring  to the stepdown unit. Patient with the [place] -3  Have started heparin gtt  per pharmacy and consulted cardiology to see patient. Will also check thyroid levels.    [place] check a prealbumin in am to further classify malnutrition. Will try and supplement with chocolate boost as the patient does not like ensure.   General weakness- Suspected secondary to malnutrition and dehydration. Will hydrate and had physical therapy to eval and treat.   Severe dehydration-Providing patient with gentle IVF hydration. Will need to assess to see if altered mental status improves. As there are no focal deficits seen on physical exam and CT scan was negative.   Lactic acidosis- elevated at 2.78. Suspected related to the patient's severe dehydration, but have not been able to rule out a possible infectionunderlying at this time. Will recheck lactic acid levels in a.m.  SIRS (systemic inflammatory response syndrome) ([place])-  Patient was seen have elevated white blood cell count with an elevated lactic acid level. Obtain cultures of blood and UA. UA was negative. Blood cultures are pending. No specific source of  infection seen at this time . Will monitor clinically for now unless prompted to add on antibiotics. Would consider empirically adding on antibiotics and patient developed fever greater than 100.5 degrees Fahrenheit.    Acute kidney injury- patient's baseline creatinine is seen to be around 1.23. On admission her hospitalization today was elevated at 1.64 suspected secondary to severe dehydration as seen on physical exam. Will provide the  patient with IV fluids and recheck renal function panel in a.m. to see if kidney function is improved with just gentle hydration.  Chronic pain - continued some of patient's home medications this time   Consults- cardiology  Code Status:   DO NOT RESUSCITATE discussed in detail with family who are at bedside. Family Communication: bedside Disposition Plan: admit to cardiac stepdown secondary to the patient being on a Cardizem drip  Total time spent 55 minutes.Greater than 50% of this time was spent in counseling, explanation of diagnosis, planning of further management, and coordination of care  [facility] Pager [phone]  If 7PM-7AM, please contact night-coverage [email or web address] Password [email or web address] 07/02/2015, 6:08 PM

## 2015-07-02 NOTE — ED Notes (Signed)
HR 130-140s, EKG completed, given to MD [place].

## 2015-07-02 NOTE — ED Notes (Signed)
Pt presents via [facility] from MD office for new onset Afib, no hx of.  Pt was also being seen at MD office for failure to thrive x 1 month, pt staying in bed and not wanting to get up.  Pt from independent living [place].  Family also reported to EMS that pt has been seeing family members when they were not there.  Pt a to self and place.  BP-138/88 P-105 Afib , R-18, O2--99% RA.  Pt NAD.  Hx: HLD, HTN, UTI, GERD, depression.

## 2015-07-02 NOTE — Progress Notes (Signed)
Pre visit review using our clinic review tool, if applicable. No additional management support is needed unless otherwise documented below in the visit note. 

## 2015-07-02 NOTE — ED Notes (Signed)
Attempted report 

## 2015-07-02 NOTE — Telephone Encounter (Signed)
Ok with me 

## 2015-07-02 NOTE — Progress Notes (Signed)
Subjective:    Patient ID: [name], female    DOB: [birth year], 81 y.o.   MRN: [id number]  HPI She is here today before her legs are weak and she is no longer able to stand on her own.  She has fallen several times.  She has whole body pain - from her head to her toes.  She has had decreased appetite, decreased food and drink intake and weight loss.  Three weeks ago these symptoms started suddenly.  She has continued to deteriorate since then.  Yesterday she ate a bite of a donut and a couple spoons of yogurt only.  She drinks minimal water throughout the day.  Prior to this she was independent.  She was getting around with her scooter and able to ambulate.  She was able to fix her meals, eat them and go to the bathroom on her own.  She had a good appetite.      She is here today with her daughter and neighbor (daughter's mother-in-law).  The patient has had confusion and some hallucinations - she has seen her mother who is deceased.     Medications and allergies reviewed with patient and updated if appropriate.  Patient Active Problem List   Diagnosis Date Noted  . Essential hypertension 11/04/2014  . Urinary incontinence 10/30/2014  . Bilateral leg pain 10/30/2014  . Elevated antinuclear antibody (ANA) level 06/03/2014  . Abnormal serum protein electrophoresis 05/20/2014  . Peripheral cyanosis 05/20/2014  . B12 deficiency 05/08/2014  . Elevated blood pressure reading without diagnosis of hypertension 05/08/2014  . Hyperlipidemia 05/18/2012  . Peripheral neuropathy ([place]) 12/02/2011  . Right low back pain 11/24/2011  . Gait abnormality 11/24/2011  . Dysuria 08/11/2011  . Cramps, extremity 08/11/2011  . General weakness 06/10/2011  . Fibromyalgia 05/11/2011  . Chronic pain 05/11/2011  . Vertigo 05/11/2011  . Impaired glucose tolerance 05/11/2011  . Preventative health care 05/11/2011  . DEGENERATIVE DISC DISEASE, LUMBOSACRAL SPINE W/RADICULOPATHY 02/03/2010  . DEGENERATIVE  JOINT DISEASE, ANKLE 01/01/2010  . PARESTHESIA 01/01/2010  . MENOPAUSAL DISORDER 05/28/2009  . RASH-NONVESICULAR 11/14/2007  . ANEMIA-NOS 10/12/2007  . ALLERGIC RHINITIS 10/12/2007  . OSTEOPOROSIS 10/12/2007  . FATIGUE 10/12/2007  . MALNUTRITION 06/14/2007  . ANXIETY 06/14/2007  . DEPRESSION 06/14/2007  . GERD 06/14/2007  . GASTROENTERITIS/COLITIS D/T RADIATION 06/14/2007  . IBS 06/14/2007  . CERVICAL CANCER, HX OF 06/14/2007    Past Medical History  Diagnosis Date  . ALLERGIC RHINITIS   . ANEMIA-NOS   . ANXIETY   . CERVICAL CANCER, HX OF   . DEGENERATIVE DISC DISEASE, LUMBOSACRAL SPINE W/RADICULOPATHY   . DEGENERATIVE JOINT DISEASE, ANKLE   . DEPRESSION   . GASTROENTERITIS/COLITIS D/T RADIATION   . GERD   . GLUCOSE INTOLERANCE   . IBS   . MALNUTRITION   . MENOPAUSAL DISORDER   . OSTEOPOROSIS   . Peripheral neuropathy ([place]) 12/02/2011  . Hyperlipidemia 05/18/2012  . B12 deficiency 05/08/2014  . Cervical cancer ([place])   . Abnormal serum protein electrophoresis 05/20/2014  . Elevated antinuclear antibody (ANA) level 06/03/2014    Past Surgical History  Procedure Laterality Date  . Abdominal hysterectomy    . (l) hip fracture    . S/p sb resection and stricturoplasty  09/2000  . Appendectomy    . Oophorectomy      Social History   Social History  . Marital Status: Widowed    Spouse Name: N/A  . Number of Children: N/A  . Years of  Education: N/A   Social History Main Topics  . Smoking status: Never Smoker   . Smokeless tobacco: Never Used  . Alcohol Use: No  . Drug Use: No  . Sexual Activity: Not Asked   Other Topics Concern  . None   Social History Narrative    Review of Systems  Constitutional: Positive for fever (low grade, 99.9 at home), activity change, appetite change (decreased), fatigue and unexpected weight change (weight loss).  HENT: Negative for congestion, ear pain and sore throat.   Respiratory: Positive for shortness of breath (occasional  sob). Negative for cough and wheezing.   Cardiovascular: Positive for leg swelling (chronic). Negative for chest pain and palpitations.  Gastrointestinal: Positive for nausea and diarrhea. Negative for vomiting, abdominal pain and blood in stool.  Genitourinary: Positive for dysuria.       Dark urine  Musculoskeletal:       Whole body pain  Neurological: Positive for dizziness, weakness (whole body, leg are so weak can not stand on own), light-headedness, numbness (numbness/tingling in toes) and headaches.  Psychiatric/Behavioral: Positive for confusion.       Objective:   Filed Vitals:   07/02/15 1111  BP: 138/88  Pulse: 46  Temp: 99.1 F (37.3 C)  Resp: 18   Filed Weights   07/02/15 1111  Weight: 114 lb (51.71 kg)   Body mass index is 20.85 kg/(m^2).   Physical Exam  Constitutional:  Frail, elderly female in wheelchair who appear ill  HENT:  Head: Normocephalic and atraumatic.  Right Ear: External ear normal.  Left Ear: External ear normal.  Dry mucus membranes  Eyes: Conjunctivae are normal.  Neck: Neck supple. No tracheal deviation present. No thyromegaly present.  Cardiovascular:  No murmur heard. Irregular rhythm and rate   Pulmonary/Chest: Breath sounds normal. She is in respiratory distress (mild sob at times). She has no wheezes. She has no rales.  Abdominal: Soft. She exhibits no distension. There is tenderness.  Exam was difficult since she was in the wheelchair  Musculoskeletal: She exhibits no edema.  Lymphadenopathy:    She has no cervical adenopathy.  Neurological:  Alert, but appears fatigued  Skin: No rash noted.  Bruising on lower legs          Assessment & Plan:   3 weeks failure to thrive associated with decreased appetite, decreased PO intake and weight loss, generalized body pain and weakness unable to stand, low grade fever, sob and new onset Afib  afib may be primary or secondary She may have an infection which needs to be ruled  out She needs full evaluation of these symptoms and is not able to care for herself and needs hydration/support.   Discussed with family - we will have her go to [place] called to take patient over

## 2015-07-03 ENCOUNTER — Inpatient Hospital Stay (HOSPITAL_COMMUNITY): Payer: Medicare Other

## 2015-07-03 DIAGNOSIS — N179 Acute kidney failure, unspecified: Secondary | ICD-10-CM

## 2015-07-03 DIAGNOSIS — I4891 Unspecified atrial fibrillation: Secondary | ICD-10-CM

## 2015-07-03 DIAGNOSIS — G8929 Other chronic pain: Secondary | ICD-10-CM

## 2015-07-03 DIAGNOSIS — R627 Adult failure to thrive: Secondary | ICD-10-CM | POA: Insufficient documentation

## 2015-07-03 LAB — CBC
HEMATOCRIT: 35 % — AB (ref 36.0–46.0)
Hemoglobin: 10.8 g/dL — ABNORMAL LOW (ref 12.0–15.0)
MCH: 26.8 pg (ref 26.0–34.0)
MCHC: 30.9 g/dL (ref 30.0–36.0)
MCV: 86.8 fL (ref 78.0–100.0)
Platelets: 216 10*3/uL (ref 150–400)
RBC: 4.03 MIL/uL (ref 3.87–5.11)
RDW: 15.2 % (ref 11.5–15.5)
WBC: 11.8 10*3/uL — ABNORMAL HIGH (ref 4.0–10.5)

## 2015-07-03 LAB — TROPONIN I
TROPONIN I: 0.03 ng/mL (ref <0.031)
Troponin I: <0.03 ng/mL (ref <0.031)

## 2015-07-03 LAB — TSH: TSH: 0.363 u[IU]/mL (ref 0.350–4.500)

## 2015-07-03 LAB — C DIFFICILE QUICK SCREEN W PCR REFLEX
C DIFFICILE (CDIFF) INTERP: NEGATIVE
C Diff antigen: NEGATIVE
C Diff toxin: NEGATIVE

## 2015-07-03 LAB — COMPREHENSIVE METABOLIC PANEL
ALT: 19 U/L (ref 14–54)
AST: 30 U/L (ref 15–41)
Albumin: 1.7 g/dL — ABNORMAL LOW (ref 3.5–5.0)
Alkaline Phosphatase: 57 U/L (ref 38–126)
Anion gap: 12 (ref 5–15)
BILIRUBIN TOTAL: 0.5 mg/dL (ref 0.3–1.2)
BUN: 13 mg/dL (ref 6–20)
CO2: 23 mmol/L (ref 22–32)
Calcium: 8.1 mg/dL — ABNORMAL LOW (ref 8.9–10.3)
Chloride: 104 mmol/L (ref 101–111)
Creatinine, Ser: 1.38 mg/dL — ABNORMAL HIGH (ref 0.44–1.00)
GFR calc Af Amer: 38 mL/min — ABNORMAL LOW (ref >60)
GFR, EST NON AFRICAN AMERICAN: 33 mL/min — AB (ref >60)
Glucose, Bld: 102 mg/dL — ABNORMAL HIGH (ref 65–99)
POTASSIUM: 3.5 mmol/L (ref 3.5–5.1)
Sodium: 139 mmol/L (ref 135–145)
TOTAL PROTEIN: 4.9 g/dL — AB (ref 6.5–8.1)

## 2015-07-03 LAB — HEPARIN LEVEL (UNFRACTIONATED)
HEPARIN UNFRACTIONATED: 0.15 [IU]/mL — AB (ref 0.30–0.70)
Heparin Unfractionated: 0.33 IU/mL (ref 0.30–0.70)

## 2015-07-03 LAB — PREALBUMIN: Prealbumin: 6.1 mg/dL — ABNORMAL LOW (ref 18–38)

## 2015-07-03 LAB — MRSA PCR SCREENING: MRSA BY PCR: NEGATIVE

## 2015-07-03 LAB — LACTIC ACID, PLASMA: LACTIC ACID, VENOUS: 1 mmol/L (ref 0.5–2.0)

## 2015-07-03 LAB — PHOSPHORUS: Phosphorus: 2.8 mg/dL (ref 2.5–4.6)

## 2015-07-03 LAB — MAGNESIUM: MAGNESIUM: 1.4 mg/dL — AB (ref 1.7–2.4)

## 2015-07-03 MED ORDER — ENSURE ENLIVE PO LIQD
237.0000 mL | Freq: Three times a day (TID) | ORAL | Status: DC
  Administered 2015-07-03 – 2015-07-05 (×4): 237 mL via ORAL

## 2015-07-03 MED ORDER — HEPARIN BOLUS VIA INFUSION
1000.0000 [IU] | Freq: Once | INTRAVENOUS | Status: AC
  Administered 2015-07-03: 1000 [IU] via INTRAVENOUS
  Filled 2015-07-03: qty 1000

## 2015-07-03 MED ORDER — SODIUM CHLORIDE 0.9 % IV SOLN
INTRAVENOUS | Status: DC
  Administered 2015-07-03: 17:00:00 via INTRAVENOUS

## 2015-07-03 MED ORDER — SACCHAROMYCES BOULARDII 250 MG PO CAPS
250.0000 mg | ORAL_CAPSULE | Freq: Two times a day (BID) | ORAL | Status: DC
  Administered 2015-07-03 – 2015-07-07 (×8): 250 mg via ORAL
  Filled 2015-07-03 (×8): qty 1

## 2015-07-03 MED ORDER — POTASSIUM CHLORIDE CRYS ER 20 MEQ PO TBCR
40.0000 meq | EXTENDED_RELEASE_TABLET | Freq: Once | ORAL | Status: AC
  Administered 2015-07-03: 40 meq via ORAL
  Filled 2015-07-03: qty 2

## 2015-07-03 NOTE — Evaluation (Signed)
Physical Therapy Evaluation Patient Details Name: [name] MRN: [id number] DOB: Nov 24, 1924 Today's Date: 07/03/2015   History of Present Illness  Patient is a 81 year old, Caucasian female with past medical history of cervical cancer status post hysterectomy, chronic pain who presents for generalized weakness and lack of appetite that have progressively worsened in the last 3 weeks. PMHx: HTN, IBS, Afib  Clinical Impression  Patient in bed, self-limiting with participation in PT, benefits from increased motivation and cues, including from daughter, to get OOB today. Patient was able to transfer as described below. Daughter reports this is far from her general mood and disposition but has been going on since weakness became an issue. Patient will benefit from continued PT while in the hospital to improve independence with transfers with a goal to progress to ambulation.    Follow Up Recommendations SNF;Supervision/Assistance - 24 hour    Equipment Recommendations  Other (comment) (TBA)    Recommendations for Other Services       Precautions / Restrictions Precautions Precautions: Fall Restrictions Weight Bearing Restrictions: No      Mobility  Bed Mobility Overal bed mobility: Needs Assistance Bed Mobility: Supine to Sit     Supine to sit: Max assist     General bed mobility comments: Patient requiring max motivation and VC's to get OOB, eventually able to get up with Max A.  Transfers Overall transfer level: Needs assistance Equipment used: 2 person hand held assist Transfers: Set designer Transfers;Sit to/from Stand Sit to Stand: Max assist   Squat pivot transfers: Max assist     General transfer comment: Required Max A to transfer bed to chair and for pericare. Patient could only tolerate max A standing for 30 seconds.   Ambulation/Gait             General Gait Details: Deferred due to patient weakness and refusal.  Stairs            Wheelchair  Mobility    Modified Rankin (Stroke Patients Only)       Balance Overall balance assessment: Needs assistance Sitting-balance support: Feet supported;Bilateral upper extremity supported Sitting balance-Leahy Scale: Zero Sitting balance - Comments: Patient requiring max A for sitting balance. Complete posterior lean. Postural control: Posterior lean     Standing balance comment: Patient could not get fully upright to standing - was able to get buttocks off bed/chair for pericare.                             Pertinent Vitals/Pain Pain Assessment: No/denies pain    Home Living Family/patient expects to be discharged to:: Private residence Living Arrangements: Alone Available Help at Discharge: Family;Available PRN/intermittently Type of Home: Independent living facility       Home Layout: One level Home Equipment: [place] - 4 wheels;Shower seat - built in;Grab bars - toilet;Grab bars - tub/shower Additional Comments: Patient was independent per daughter report 3 weeks ago, then stopped eating and became weak.    Prior Function Level of Independence: Independent with assistive device(s)               Hand Dominance   Dominant Hand: Right    Extremity/Trunk Assessment   Upper Extremity Assessment: Generalized weakness           Lower Extremity Assessment: Generalized weakness (Patient self-limiting with assessment.)         Communication   Communication: No difficulties  Cognition Arousal/Alertness: Lethargic Behavior During Therapy: Flat  affect Overall Cognitive Status: Difficult to assess                      General Comments      Exercises        Assessment/Plan    PT Assessment Patient needs continued PT services  PT Diagnosis Difficulty walking;Abnormality of gait;Generalized weakness   PT Problem List Decreased strength;Decreased range of motion;Decreased activity tolerance;Decreased balance;Decreased  mobility;Decreased knowledge of use of DME  PT Treatment Interventions DME instruction;Gait training;Stair training;Functional mobility training;Therapeutic activities;Therapeutic exercise;Balance training;Patient/family education   PT Goals (Current goals can be found in the Care Plan section) Acute Rehab PT Goals Patient Stated Goal: Unable to state. PT Goal Formulation: With patient/family Time For Goal Achievement: 07/17/15 Potential to Achieve Goals: Fair    Frequency Min 3X/week   Barriers to discharge Decreased caregiver support Lives alone and does not have help during the day.    Co-evaluation               End of Session   Activity Tolerance: Patient limited by fatigue;Patient limited by lethargy;Other (comment) (Self-limiting.) Patient left: in chair;with call bell/phone within reach;with chair alarm set;with family/visitor present Nurse Communication:  (Pt requesting cup of ice.)         Time: 5784-6962 PT Time Calculation (min) (ACUTE ONLY): 29 min   Charges:   PT Evaluation $Initial PT Evaluation Tier I: 1 Procedure PT Treatments $Therapeutic Activity: 8-22 mins   PT G CodesRoanna Epley, SPT [phone] 07/03/2015, 2:51 PM

## 2015-07-03 NOTE — Progress Notes (Signed)
Patient Name: [name] Date of Encounter: 07/03/2015   SUBJECTIVE  Did not slept well. Had rough morning. NO CP or SOB.   CURRENT MEDS . aspirin EC  81 mg Oral Daily  . feeding supplement  1 Container Oral TID BM  . multivitamin with minerals  1 tablet Oral Daily  . pantoprazole  40 mg Oral Daily  . sertraline  100 mg Oral Daily  . sodium chloride  3 mL Intravenous Q12H    OBJECTIVE  Filed Vitals:   07/02/15 2253 07/03/15 0014 07/03/15 0400 07/03/15 0653  BP: 96/61 113/71 124/74 99/57  Pulse:  108 92 89  Temp:  98.6 F (37 C) 99.8 F (37.7 C)   TempSrc:  Oral Oral   Resp:  16 18 18   Height:      Weight:   117 lb 9.6 oz (53.343 kg)   SpO2: 97% 93% 90% 99%    Intake/Output Summary (Last 24 hours) at 07/03/15 0935 Last data filed at 07/02/15 2223  Gross per 24 hour  Intake      0 ml  Output    100 ml  Net   -100 ml   Filed Weights   07/03/15 0400  Weight: 117 lb 9.6 oz (53.343 kg)    PHYSICAL EXAM  General: Pleasant, NAD. Neuro: Alert and oriented X 3. Moves all extremities spontaneously. Psych: Normal affect. HEENT:  Normal  Neck: Supple without bruits or JVD. Lungs:  Resp regular and unlabored, CTA. Heart: IR IR no s3, s4, or murmurs. Abdomen: Soft, non-tender, non-distended, BS + x 4.  Extremities: No clubbing, cyanosis or edema. DP/PT/Radials 2+ and equal bilaterally.  Accessory Clinical Findings  CBC  Recent Labs  07/02/15 1315 07/03/15 0150  WBC 15.8* 11.8*  HGB 12.0 10.8*  HCT 39.0 35.0*  MCV 87.1 86.8  PLT 246 737   Basic Metabolic Panel  Recent Labs  07/02/15 1315 07/03/15 0150  NA 135 139  K 3.7 3.5  CL 97* 104  CO2 23 23  GLUCOSE 99 102*  BUN 18 13  CREATININE 1.64* 1.38*  CALCIUM 8.7* 8.1*  MG  --  1.4*  PHOS  --  2.8   Liver Function Tests  Recent Labs  07/03/15 0150  AST 30  ALT 19  ALKPHOS 57  BILITOT 0.5  PROT 4.9*  ALBUMIN 1.7*   Cardiac Enzymes  Recent Labs  07/02/15 1315 07/02/15 2043  07/03/15 0150 07/03/15 0740  CKTOTAL 37*  --   --   --   TROPONINI <0.03 <0.03 <0.03 0.03   Thyroid Function Tests  Recent Labs  07/03/15 0438  TSH 0.363    TELE  afib at rate of 90-100s  Radiology/Studies  Dg Chest 2 View  07/02/2015  CLINICAL DATA:  Weakness and malaise ; atrial fibrillation EXAM: CHEST  2 VIEW COMPARISON:  June 03, 2015 FINDINGS: There is no edema or consolidation. There is slight scarring in the bases. Heart is upper normal in size with pulmonary vascularity within normal limits. There is calcification in the mitral annulus. There is a moderate-sized hiatal hernia. No adenopathy. Bones are diffusely osteoporotic. There is stable anterior wedging of the T12 and L1 vertebral bodies. IMPRESSION: No edema or consolidation. Bones osteoporotic. Heart is upper normal in size. There is a moderate hiatal hernia. There is extensive mitral annulus calcification. Electronically Signed   By: Lowella Grip III M.D.   On: 07/02/2015 13:40   Dg Chest 2 View  06/03/2015  CLINICAL DATA:  Chest pain.  Weakness. EXAM: CHEST  2 VIEW COMPARISON:  05/22/2014 FINDINGS: Cardiopericardial silhouette within normal limits. Mediastinal contours normal. Trachea midline. No airspace disease. Small bilateral pleural effusions are present. Dense mitral annular calcification. Monitoring leads project over the chest. IMPRESSION: Small bilateral pleural effusions are new since osseous survey 05/22/2014, suggesting an element of mild CHF. Electronically Signed   By: Dereck Ligas M.D.   On: 06/03/2015 11:17   Ct Head Wo Contrast  07/02/2015  CLINICAL DATA:  Failure to thrive.  Fall, fatigue, hallucinations. EXAM: CT HEAD WITHOUT CONTRAST CT CERVICAL SPINE WITHOUT CONTRAST TECHNIQUE: Multidetector CT imaging of the head and cervical spine was performed following the standard protocol without intravenous contrast. Multiplanar CT image reconstructions of the cervical spine were also generated.  COMPARISON:  None. FINDINGS: CT HEAD FINDINGS There is atrophy and chronic small vessel disease changes. No acute intracranial abnormality. Specifically, no hemorrhage, hydrocephalus, mass lesion, acute infarction, or significant intracranial injury. No acute calvarial abnormality. CT CERVICAL SPINE FINDINGS Normal alignment. Prevertebral soft tissues are normal. Mild diffuse degenerative facet disease bilaterally. Slight disc space narrowing at C5-6 and C6-7. No fracture. No epidural or paraspinal hematoma. IMPRESSION: No acute intracranial abnormality. Atrophy, chronic small vessel disease. No acute bony abnormality in the cervical spine. Electronically Signed   By: Rolm Baptise M.D.   On: 07/02/2015 14:57   Ct Cervical Spine Wo Contrast  07/02/2015  CLINICAL DATA:  Failure to thrive.  Fall, fatigue, hallucinations. EXAM: CT HEAD WITHOUT CONTRAST CT CERVICAL SPINE WITHOUT CONTRAST TECHNIQUE: Multidetector CT imaging of the head and cervical spine was performed following the standard protocol without intravenous contrast. Multiplanar CT image reconstructions of the cervical spine were also generated. COMPARISON:  None. FINDINGS: CT HEAD FINDINGS There is atrophy and chronic small vessel disease changes. No acute intracranial abnormality. Specifically, no hemorrhage, hydrocephalus, mass lesion, acute infarction, or significant intracranial injury. No acute calvarial abnormality. CT CERVICAL SPINE FINDINGS Normal alignment. Prevertebral soft tissues are normal. Mild diffuse degenerative facet disease bilaterally. Slight disc space narrowing at C5-6 and C6-7. No fracture. No epidural or paraspinal hematoma. IMPRESSION: No acute intracranial abnormality. Atrophy, chronic small vessel disease. No acute bony abnormality in the cervical spine. Electronically Signed   By: Rolm Baptise M.D.   On: 07/02/2015 14:57    ASSESSMENT AND PLAN Principal Problem:   1. Atrial fibrillation with RVR ([place]) - New  onset.CHADSVASC score of 4 (age, sex and HTN). Continue heparin for pharmacy for now, transition to [place] after further work up. - Continue Cardizem gtt at current rate, rate in 90-100s.   2. Failure to thrive - She is not eating and drinking well for the past 3 weeks. She is clearly dehydrated. Cr improved. Will increase IV fluid rate to 75ml/hr.   3. AKI - Cr improving on IV fluids.  4. HTN - Held amlodipine on admission due to hypotension.  - Last BP reading 99/57.   Otherwise per primary-- Active Problems:   MALNUTRITION   Chronic pain   General weakness   SIRS (systemic inflammatory response syndrome) (HCC)   Severe dehydration   Lactic acidosis     Signed, Bhagat,Bhavinkumar PA-C Pager [phone]  I have seen, examined and evaluated the patient this AM along with Mr. [place].  After reviewing all the available data and chart,  I agree with his findings, examination as well as impression recommendations.  Doing relatively well today. Did not sleep very well overnight. Still not eating well. No chest pain or  shortness of breath.  Heart rate improved, still in A. fib with rates in the 90s. For now with her borderline pressures which is continue with IV diltiazem, would consider transitioning to by mouth later on this afternoon or tomorrow. Continue IV heparin. No clear indication as a when she asked he started A. fib. At this point, my patient will probably be to await further evaluation of her weakness and improvement in renal insufficiency before we decide whether or not to use warfarin versus a NOAC. -- With his weak as she is, I'm not sure how much of a good candidate she would be for long-term anti-coagulation. Echocardiogram pending. She is significantly dehydrated, we have increased her IV fluid rate as hopefully this will help her appetite.    Leonie Man, M.D., M.S. Interventional Cardiologist   Pager # [phone]

## 2015-07-03 NOTE — Progress Notes (Signed)
Initial Nutrition Assessment  DOCUMENTATION CODES:   Severe malnutrition in context of chronic illness  INTERVENTION:    Chocolate Ensure Enlive PO TID, each supplement provides 350 kcal and 20 grams of protein  NUTRITION DIAGNOSIS:   Malnutrition related to chronic illness as evidenced by energy intake < or equal to 75% for > or equal to 1 month, percent weight loss (19% weight loss within the past 6 months).  GOAL:   Patient will meet greater than or equal to 90% of their needs  MONITOR:   PO intake, Supplement acceptance, Labs, Weight trends, Skin  REASON FOR ASSESSMENT:   Malnutrition Screening Tool    ASSESSMENT:   81 year old, Caucasian female with past medical history of cervical cancer status post hysterectomy, chronic pain who presents for generalized weakness and lack of appetite that have progressively worsened in the last 3 weeks. PMHx: HTN, IBS, Afib  Nutrition-Focused physical exam completed. Findings are mild-moderate fat depletion, mild-moderate muscle depletion, and no edema. Patient reports that she likes to eat, ate some [place] and dressing for lunch today. She seemed somewhat confused, saying the IV pole was not hers and asked if I could move it. Patient with ongoing diarrhea, being checked for C diff. Likes chocolate PO supplements. Recent poor PO intake for the past month or so with ~19% weight loss in the past 6 months. Diarrhea for the past few days.  Diet Order:  Diet Heart Room service appropriate?: Yes; Fluid consistency:: Thin  Skin:  Reviewed, no issues  Last BM:  10/13  Height:   Ht Readings from Last 1 Encounters:  07/02/15 5' 1.81" (1.57 m)    Weight:   Wt Readings from Last 1 Encounters:  07/03/15 117 lb 9.6 oz (53.343 kg)    Ideal Body Weight:  50 kg  BMI:  Body mass index is 21.64 kg/(m^2).  Estimated Nutritional Needs:   Kcal:  1350-1550  Protein:  75-85 gm  Fluid:  1.5 L  EDUCATION NEEDS:   No education needs  identified at this time  Molli Barrows, [place], [place], [place] Pager [phone] After Hours Pager [phone]

## 2015-07-03 NOTE — Progress Notes (Signed)
Notified Dr. Hartford Poli pt continuing to have watery diarrhea with excoriation to bottom around anus and labia very red. Skin protectant used after every bm. Flexiseal ordered. Carroll Kinds RN

## 2015-07-03 NOTE — Progress Notes (Addendum)
Notified Dr. Hartford Poli of pt having watery diarrhea, stool for cdiff ordered. Placed on enteric precautions. Will cont to monitor. Carroll Kinds RN

## 2015-07-03 NOTE — Progress Notes (Signed)
Pt having large amounts of watery diarrhea. Pericare and multiple linen changes throughout the day. Pt educated why the flexiseal was necessary along with daughter. Pt area around rectum raw and bleeding at times. Skin protectant used throughout the day. Will continue to monitor. Carroll Kinds RN

## 2015-07-03 NOTE — Progress Notes (Signed)
[facility] PROGRESS NOTE   [name] XTK:[id number] DOB: Sep 20, 1925 DOA: 07/02/2015 PCP: Cathlean Cower, MD  HPI/Subjective: Denies any chest pain or palpitations. Per nursing staff patient had more than 1 watery bowel movement.  Assessment/Plan: Principal Problem:   Atrial fibrillation with RVR (HCC) Active Problems:   MALNUTRITION   Chronic pain   General weakness   SIRS (systemic inflammatory response syndrome) (HCC)   Severe dehydration   Atrial fibrillation (HCC)   Lactic acidosis   AKI (acute kidney injury) ([place])   HTN (hypertension), benign   Atrial fibrillation with RVR ([place]) -Patient presented to the hospital with heart rate of 144, EKG showed A. fib with RVR. -Started on Cardizem drip in the emergency department, continued. TSH is normal -Cardiology consulted currently on heparin drip. -CHADVAS2 is 3.   MALNUTRITION Albumin is 1.7 and prealbumin is 6.1, severe protein energy malnutrition. Dietitian to evaluate.  General weakness- Suspected secondary to malnutrition and dehydration. Will hydrate and had physical therapy to eval and treat.   Severe dehydration-Providing patient with gentle IVF hydration. Will need to assess to see if altered mental status improves. As there are no focal deficits seen on physical exam and CT scan was negative.   Lactic acidosis- elevated at 2.78. Suspected related to the patient's severe dehydration, but have not been able to rule out a possible infectionunderlying at this time. Will recheck lactic acid levels in a.m.  SIRS (systemic inflammatory response syndrome) ([place])- Patient was seen have elevated white blood cell count with an elevated lactic acid level. Obtain cultures of blood and UA. UA was negative. Blood cultures are pending. No specific source of infection seen at this time . Will monitor clinically for now unless prompted to add on antibiotics. Would consider empirically adding on antibiotics and patient  developed fever greater than 100.5 degrees Fahrenheit.   Acute kidney injury patient's baseline creatinine is seen to be around 1.23.  On admission her hospitalization today was elevated at 1.64 suspected secondary to severe dehydration Started on IV fluids, creatinine is down to 1.38, continue IV fluids and check BMP in a.m.  Chronic pain - continued some of patient's home medications this time  Code Status: DNR Family Communication: Plan discussed with the patient. Disposition Plan: Remains inpatient Diet: Diet Heart Room service appropriate?: Yes; Fluid consistency:: Thin  Consultants:  Cardiology  Procedures:  None  Antibiotics:  None   Objective: Filed Vitals:   07/03/15 0653  BP: 99/57  Pulse: 89  Temp:   Resp: 18    Intake/Output Summary (Last 24 hours) at 07/03/15 1205 Last data filed at 07/02/15 2223  Gross per 24 hour  Intake      0 ml  Output    100 ml  Net   -100 ml   Filed Weights   07/03/15 0400  Weight: 53.343 kg (117 lb 9.6 oz)    Exam: General: Alert and awake, oriented x3, not in any acute distress. HEENT: anicteric sclera, pupils reactive to light and accommodation, EOMI CVS: S1-S2 clear, no murmur rubs or gallops Chest: clear to auscultation bilaterally, no wheezing, rales or rhonchi Abdomen: soft nontender, nondistended, normal bowel sounds, no organomegaly Extremities: no cyanosis, clubbing or edema noted bilaterally Neuro: Cranial nerves II-XII intact, no focal neurological deficits  Data Reviewed: Basic Metabolic Panel:  Recent Labs Lab 07/02/15 1315 07/03/15 0150  NA 135 139  K 3.7 3.5  CL 97* 104  CO2 23 23  GLUCOSE 99 102*  BUN 18 13  CREATININE  1.64* 1.38*  CALCIUM 8.7* 8.1*  MG  --  1.4*  PHOS  --  2.8   Liver Function Tests:  Recent Labs Lab 07/03/15 0150  AST 30  ALT 19  ALKPHOS 57  BILITOT 0.5  PROT 4.9*  ALBUMIN 1.7*   No results for input(s): LIPASE, AMYLASE in the last 168 hours. No results  for input(s): AMMONIA in the last 168 hours. CBC:  Recent Labs Lab 07/02/15 1315 07/03/15 0150  WBC 15.8* 11.8*  HGB 12.0 10.8*  HCT 39.0 35.0*  MCV 87.1 86.8  PLT 246 216   Cardiac Enzymes:  Recent Labs Lab 07/02/15 1315 07/02/15 2043 07/03/15 0150 07/03/15 0740  CKTOTAL 37*  --   --   --   TROPONINI <0.03 <0.03 <0.03 0.03   BNP (last 3 results) No results for input(s): BNP in the last 8760 hours.  ProBNP (last 3 results) No results for input(s): PROBNP in the last 8760 hours.  CBG: No results for input(s): GLUCAP in the last 168 hours.  Micro Recent Results (from the past 240 hour(s))  Rapid strep screen     Status: None   Collection Time: 07/02/15  3:19 PM  Result Value Ref Range Status   Streptococcus, Group A Screen (Direct) NEGATIVE NEGATIVE Final    Comment: (NOTE) A Rapid Antigen test may result negative if the antigen level in the sample is below the detection level of this test. The FDA has not cleared this test as a stand-alone test therefore the rapid antigen negative result has reflexed to a Group A Strep culture.   MRSA PCR Screening     Status: None   Collection Time: 07/02/15  9:23 PM  Result Value Ref Range Status   MRSA by PCR NEGATIVE NEGATIVE Final    Comment:        The GeneXpert MRSA Assay (FDA approved for NASAL specimens only), is one component of a comprehensive MRSA colonization surveillance program. It is not intended to diagnose MRSA infection nor to guide or monitor treatment for MRSA infections.      Studies: Dg Chest 2 View  07/02/2015  CLINICAL DATA:  Weakness and malaise ; atrial fibrillation EXAM: CHEST  2 VIEW COMPARISON:  June 03, 2015 FINDINGS: There is no edema or consolidation. There is slight scarring in the bases. Heart is upper normal in size with pulmonary vascularity within normal limits. There is calcification in the mitral annulus. There is a moderate-sized hiatal hernia. No adenopathy. Bones are  diffusely osteoporotic. There is stable anterior wedging of the T12 and L1 vertebral bodies. IMPRESSION: No edema or consolidation. Bones osteoporotic. Heart is upper normal in size. There is a moderate hiatal hernia. There is extensive mitral annulus calcification. Electronically Signed   By: Lowella Grip III M.D.   On: 07/02/2015 13:40   Ct Head Wo Contrast  07/02/2015  CLINICAL DATA:  Failure to thrive.  Fall, fatigue, hallucinations. EXAM: CT HEAD WITHOUT CONTRAST CT CERVICAL SPINE WITHOUT CONTRAST TECHNIQUE: Multidetector CT imaging of the head and cervical spine was performed following the standard protocol without intravenous contrast. Multiplanar CT image reconstructions of the cervical spine were also generated. COMPARISON:  None. FINDINGS: CT HEAD FINDINGS There is atrophy and chronic small vessel disease changes. No acute intracranial abnormality. Specifically, no hemorrhage, hydrocephalus, mass lesion, acute infarction, or significant intracranial injury. No acute calvarial abnormality. CT CERVICAL SPINE FINDINGS Normal alignment. Prevertebral soft tissues are normal. Mild diffuse degenerative facet disease bilaterally. Slight disc space narrowing at  C5-6 and C6-7. No fracture. No epidural or paraspinal hematoma. IMPRESSION: No acute intracranial abnormality. Atrophy, chronic small vessel disease. No acute bony abnormality in the cervical spine. Electronically Signed   By: Rolm Baptise M.D.   On: 07/02/2015 14:57   Ct Cervical Spine Wo Contrast  07/02/2015  CLINICAL DATA:  Failure to thrive.  Fall, fatigue, hallucinations. EXAM: CT HEAD WITHOUT CONTRAST CT CERVICAL SPINE WITHOUT CONTRAST TECHNIQUE: Multidetector CT imaging of the head and cervical spine was performed following the standard protocol without intravenous contrast. Multiplanar CT image reconstructions of the cervical spine were also generated. COMPARISON:  None. FINDINGS: CT HEAD FINDINGS There is atrophy and chronic small  vessel disease changes. No acute intracranial abnormality. Specifically, no hemorrhage, hydrocephalus, mass lesion, acute infarction, or significant intracranial injury. No acute calvarial abnormality. CT CERVICAL SPINE FINDINGS Normal alignment. Prevertebral soft tissues are normal. Mild diffuse degenerative facet disease bilaterally. Slight disc space narrowing at C5-6 and C6-7. No fracture. No epidural or paraspinal hematoma. IMPRESSION: No acute intracranial abnormality. Atrophy, chronic small vessel disease. No acute bony abnormality in the cervical spine. Electronically Signed   By: Rolm Baptise M.D.   On: 07/02/2015 14:57    Scheduled Meds: . aspirin EC  81 mg Oral Daily  . feeding supplement  1 Container Oral TID BM  . multivitamin with minerals  1 tablet Oral Daily  . pantoprazole  40 mg Oral Daily  . sertraline  100 mg Oral Daily  . sodium chloride  3 mL Intravenous Q12H   Continuous Infusions: . diltiazem (CARDIZEM) infusion 10 mg/hr (07/03/15 0000)  . heparin 900 Units/hr (07/03/15 0237)       Time spent: 35 minutes    [facility] A  [facility] Pager [phone] If 7PM-7AM, please contact night-coverage at [email or web address], password [email or web address] 07/03/2015, 12:05 PM  LOS: 1 day

## 2015-07-03 NOTE — Telephone Encounter (Signed)
Does patient need to make appointment to establish care since she has already been seen?

## 2015-07-03 NOTE — Telephone Encounter (Signed)
Ok with me 

## 2015-07-03 NOTE — Progress Notes (Signed)
ANTICOAGULATION CONSULT NOTE - Follow Up Consult  Pharmacy Consult for heparin Indication: atrial fibrillation   Labs:  Recent Labs  07/02/15 1315 07/02/15 2043 07/03/15 0150  HGB 12.0  --  10.8*  HCT 39.0  --  35.0*  PLT 246  --  216  HEPARINUNFRC  --   --  0.15*  CREATININE 1.64*  --   --   CKTOTAL 37*  --   --   TROPONINI <0.03 <0.03  --      Assessment: 81yo female subtherapeutic on heparin with initial dosing for Afib.  Goal of Therapy:  Heparin level 0.3-0.7 units/ml   Plan:  Will rebolus with heparin 1000 units and increase gtt by 3 units/kg/hr to 900 units/hr and check level in [place], PharmD, BCPS  07/03/2015,2:25 AM

## 2015-07-03 NOTE — Care Management Note (Addendum)
Case Management Note  Patient Details  Name: [name] MRN: [id number] Date of Birth: 1925-08-24  Subjective/Objective:  Pt admitted for A Fib RVR- Pt is from [facility] [place].                   Action/Plan: Plan will be for SNF once medically stable for d/c. CSW to assist with disposition needs. CM will continue to monitor.    Expected Discharge Date:                  Expected Discharge Plan:  Skilled Nursing Facility  In-House Referral:  Clinical Social Work  Discharge planning Services  CM Consult  Post Acute Care Choice:  NA Choice offered to:  NA  DME Arranged:  N/A DME Agency:  NA  HH Arranged:  NA HH Agency:  NA  Status of Service:  Completed, signed off  Medicare Important Message Given:    Date Medicare IM Given:    Medicare IM give by:    Date Additional Medicare IM Given:    Additional Medicare Important Message give by:     If discussed at [place] of Stay Meetings, dates discussed:    Additional Comments: 1114 07-07-15 Jacqlyn Krauss, RN,BSN [phone] Pt will be d/c today to [facility]. No needs from CM at this time.   Bethena Roys, RN 07/03/2015, 2:55 PM

## 2015-07-03 NOTE — Progress Notes (Signed)
UR Completed Anquinette Pierro Graves-Bigelow, RN,BSN [phone]  

## 2015-07-03 NOTE — Progress Notes (Signed)
ANTICOAGULATION CONSULT NOTE - F/U Consult  Pharmacy Consult for heparin Indication: atrial fibrillation  No Known Allergies  Patient Measurements: Height: 5' 1.81" (157 cm) (10/30/2014) Weight: 117 lb 9.6 oz (53.343 kg) IBW/kg (Calculated) : 49.67  Vital Signs: Temp: 99.8 F (37.7 C) (10/13 0400) Temp Source: Oral (10/13 0400) BP: 99/57 mmHg (10/13 0653) Pulse Rate: 89 (10/13 0653)  Labs:  Recent Labs  07/02/15 1315 07/02/15 2043 07/03/15 0150 07/03/15 0740 07/03/15 1015  HGB 12.0  --  10.8*  --   --   HCT 39.0  --  35.0*  --   --   PLT 246  --  216  --   --   HEPARINUNFRC  --   --  0.15*  --  0.33  CREATININE 1.64*  --  1.38*  --   --   CKTOTAL 37*  --   --   --   --   TROPONINI <0.03 <0.03 <0.03 0.03  --     Estimated Creatinine Clearance: 22.1 mL/min (by C-G formula based on Cr of 1.38).  Assessment: 81 yo F presenting with confusion, weakness, and lack of appetite. New onset a fib in ER   PMH: cervical ca s/p hysterectomy, chronic pain  AC: heparin for AFib. No ac pta. HL 0.33 on 900 units/hr  CV: new onset a fib on diltiazem gtt. Trop neg x 3. No cardiac hx? ECHO Pending  Renal: SCr 1.38, down from admit of 1.64  Heme: H&H 10.8/35, Plt 216  Goal of Therapy:  Heparin level 0.3-0.7 units/ml Monitor platelets by anticoagulation protocol: Yes   Plan:  Continue heparin 900 units/hr Daily HL, CBC Monitor for s/sx of bleeding F/U long-term [facility] plans  Levester Fresh, PharmD, BCPS Clinical Pharmacist Pager [phone] 07/03/2015 12:56 PM

## 2015-07-03 NOTE — Progress Notes (Signed)
  Echocardiogram 2D Echocardiogram has been performed.  Roseanna Rainbow R 07/03/2015, 11:22 AM

## 2015-07-04 DIAGNOSIS — E43 Unspecified severe protein-calorie malnutrition: Secondary | ICD-10-CM

## 2015-07-04 DIAGNOSIS — I4819 Other persistent atrial fibrillation: Secondary | ICD-10-CM | POA: Insufficient documentation

## 2015-07-04 DIAGNOSIS — R627 Adult failure to thrive: Secondary | ICD-10-CM

## 2015-07-04 DIAGNOSIS — I481 Persistent atrial fibrillation: Secondary | ICD-10-CM

## 2015-07-04 LAB — CBC
HEMATOCRIT: 34.7 % — AB (ref 36.0–46.0)
Hemoglobin: 10.7 g/dL — ABNORMAL LOW (ref 12.0–15.0)
MCH: 26.6 pg (ref 26.0–34.0)
MCHC: 30.8 g/dL (ref 30.0–36.0)
MCV: 86.3 fL (ref 78.0–100.0)
Platelets: 221 10*3/uL (ref 150–400)
RBC: 4.02 MIL/uL (ref 3.87–5.11)
RDW: 15.3 % (ref 11.5–15.5)
WBC: 13.1 10*3/uL — ABNORMAL HIGH (ref 4.0–10.5)

## 2015-07-04 LAB — BASIC METABOLIC PANEL
ANION GAP: 11 (ref 5–15)
BUN: 12 mg/dL (ref 6–20)
CALCIUM: 7.9 mg/dL — AB (ref 8.9–10.3)
CO2: 20 mmol/L — AB (ref 22–32)
CREATININE: 1.07 mg/dL — AB (ref 0.44–1.00)
Chloride: 106 mmol/L (ref 101–111)
GFR calc Af Amer: 52 mL/min — ABNORMAL LOW (ref >60)
GFR calc non Af Amer: 45 mL/min — ABNORMAL LOW (ref >60)
GLUCOSE: 127 mg/dL — AB (ref 65–99)
Potassium: 3.8 mmol/L (ref 3.5–5.1)
Sodium: 137 mmol/L (ref 135–145)

## 2015-07-04 LAB — CULTURE, GROUP A STREP: Strep A Culture: NEGATIVE

## 2015-07-04 LAB — HEPARIN LEVEL (UNFRACTIONATED): Heparin Unfractionated: 0.28 IU/mL — ABNORMAL LOW (ref 0.30–0.70)

## 2015-07-04 MED ORDER — VANCOMYCIN HCL IN DEXTROSE 1-5 GM/200ML-% IV SOLN
1000.0000 mg | Freq: Once | INTRAVENOUS | Status: AC
  Administered 2015-07-04: 1000 mg via INTRAVENOUS
  Filled 2015-07-04: qty 200

## 2015-07-04 MED ORDER — PIPERACILLIN-TAZOBACTAM 3.375 G IVPB
3.3750 g | Freq: Three times a day (TID) | INTRAVENOUS | Status: DC
  Administered 2015-07-04 – 2015-07-06 (×7): 3.375 g via INTRAVENOUS
  Filled 2015-07-04 (×10): qty 50

## 2015-07-04 MED ORDER — VANCOMYCIN HCL 500 MG IV SOLR
500.0000 mg | INTRAVENOUS | Status: DC
  Administered 2015-07-05 – 2015-07-06 (×2): 500 mg via INTRAVENOUS
  Filled 2015-07-04 (×2): qty 500

## 2015-07-04 MED ORDER — OFF THE BEAT BOOK
Freq: Once | Status: AC
  Administered 2015-07-04: 10:00:00
  Filled 2015-07-04: qty 1

## 2015-07-04 NOTE — Progress Notes (Signed)
Pt's blood cultures came back with gram positive rods in the areobic bottle. Np on call made aware. Will cont to monitor pt.

## 2015-07-04 NOTE — Discharge Instructions (Signed)

## 2015-07-04 NOTE — Clinical Social Work Note (Signed)
Clinical Social Work Assessment  Patient Details  Name: [name] MRN: [id number] Date of Birth: 04/28/1925  Date of referral:  07/04/15               Reason for consult:  Discharge Planning, Facility Placement                Permission sought to share information with:  Family Supports, Chartered certified accountant granted to share information::  Yes, Verbal Permission Granted  Name::     Theatre manager::  SNFs  Relationship::     Contact Information:     Housing/Transportation Living arrangements for the past 2 months:  Apartment Source of Information:  Patient Patient Interpreter Needed:  None Criminal Activity/Legal Involvement Pertinent to Current Situation/Hospitalization:  No - Comment as needed Significant Relationships:  None Lives with:  Self Do you feel safe going back to the place where you live?  Yes Need for family participation in patient care:  Yes (Comment)  Care giving concerns:  Patient does not report any concerns but CSW is not able to reach the daughter Dawn at this time.   Social Worker assessment / plan:  CSW met with patient at bedside to complete assessment. The patient is able to articulate that she lives alone but is unable to name the place she was admitted from [facility]). The patient is not opposed to SNF placement but states she wants her daughter to handle her discharge plan. Patient is agreeable to fax out to see what will be available. CSW has left multiple messages with the daughter to discuss SNF process. CSW explained SNF search/placement process to patient. The patient is very slow to respond. CSW will follow.   Employment status:  Disabled (Comment on whether or not currently receiving Disability), Retired Forensic scientist:  Medicare PT Recommendations:  [place] / Referral to community resources:  [place]  Patient/Family's Response to care:  Patient does not report  any concerns.  Patient/Family's Understanding of and Emotional Response to Diagnosis, Current Treatment, and Prognosis:  Patient appears to have limited insight into reason for admission and her post DC needs. CSW will need to engage the family.  Emotional Assessment Appearance:  Appears stated age Attitude/Demeanor/Rapport:  Other (Appropriate, but slow to respond) Affect (typically observed):  Accepting, Appropriate, Blunt, Calm Orientation:  Oriented to Self, Oriented to Place, Oriented to  Time Alcohol / Substance use:  Never Used Psych involvement (Current and /or in the community):  No (Comment)  Discharge Needs  Concerns to be addressed:  Discharge Planning Concerns Readmission within the last 30 days:  No Current discharge risk:  Chronically ill, Physical Impairment, Lives alone Barriers to Discharge:  Continued Medical Work up   [employer] MSW, [place], [place], [phone]

## 2015-07-04 NOTE — Clinical Social Work Placement (Signed)
   CLINICAL SOCIAL WORK PLACEMENT  NOTE  Date:  07/04/2015  Patient Details  Name: [name] MRN: [id number] Date of Birth: 09/16/1925  Clinical Social Work is seeking post-discharge placement for this patient at the [place] level of care (*CSW will initial, date and re-position this form in  chart as items are completed):  Yes   Patient/family provided with [facility] Work Department's list of facilities offering this level of care within the geographic area requested by the patient (or if unable, by the patient's family).  Yes   Patient/family informed of their freedom to choose among providers that offer the needed level of care, that participate in Medicare, Medicaid or managed care program needed by the patient, have an available bed and are willing to accept the patient.  Yes   Patient/family informed of [facility]'s ownership interest in [facility] and [facility], as well as of the fact that they are under no obligation to receive care at these facilities.  PASRR submitted to EDS on 07/04/15     PASRR number received on       Existing PASRR number confirmed on       FL2 transmitted to all facilities in geographic area requested by pt/family on 07/04/15     FL2 transmitted to all facilities within larger geographic area on       Patient informed that his/her managed care company has contracts with or will negotiate with certain facilities, including the following:            Patient/family informed of bed offers received.  Patient chooses bed at       Physician recommends and patient chooses bed at      Patient to be transferred to   on  .  Patient to be transferred to facility by       Patient family notified on   of transfer.  Name of family member notified:        PHYSICIAN       Additional Comment:    _______________________________________________ Liz Beach MSW, [place], [place], [phone]

## 2015-07-04 NOTE — Progress Notes (Signed)
Patient Name: [name] Date of Encounter: 07/04/2015   SUBJECTIVE  Still not wish to eat. No SOB or CP. Very weak.   CURRENT MEDS . aspirin EC  81 mg Oral Daily  . feeding supplement  1 Container Oral TID BM  . feeding supplement (ENSURE ENLIVE)  237 mL Oral TID BM  . multivitamin with minerals  1 tablet Oral Daily  . off the beat book   Does not apply Once  . pantoprazole  40 mg Oral Daily  . piperacillin-tazobactam (ZOSYN)  IV  3.375 g Intravenous 3 times per day  . saccharomyces boulardii  250 mg Oral BID  . sertraline  100 mg Oral Daily  . sodium chloride  3 mL Intravenous Q12H  . [START ON 07/05/2015] vancomycin  500 mg Intravenous Q24H    OBJECTIVE  Filed Vitals:   07/03/15 2100 07/04/15 0000 07/04/15 0355 07/04/15 0755  BP: 123/77 128/73 138/74 119/71  Pulse:  96 94 100  Temp: 98.4 F (36.9 C) 98.3 F (36.8 C) 98.5 F (36.9 C) 98.6 F (37 C)  TempSrc: Oral Oral Oral Oral  Resp: 16 18 18 18   Height:      Weight:   117 lb 8.1 oz (53.3 kg)   SpO2: 95% 95% 95% 93%    Intake/Output Summary (Last 24 hours) at 07/04/15 1128 Last data filed at 07/04/15 0500  Gross per 24 hour  Intake   1225 ml  Output    100 ml  Net   1125 ml   Filed Weights   07/03/15 0400 07/04/15 0355  Weight: 117 lb 9.6 oz (53.343 kg) 117 lb 8.1 oz (53.3 kg)    PHYSICAL EXAM  General: Pleasant, NAD. Neuro: Alert and oriented X 3. Moves all extremities spontaneously. Psych: Normal affect. HEENT:  Normal  Neck: Supple without bruits or JVD. Lungs:  Resp regular and unlabored, CTA. Heart: IR IR no s3, s4, or murmurs. Abdomen: Soft, non-tender, non-distended, BS + x 4.  Extremities: No clubbing, cyanosis or edema. DP/PT/Radials 2+ and equal bilaterally.  Accessory Clinical Findings  CBC  Recent Labs  07/03/15 0150 07/04/15 0335  WBC 11.8* 13.1*  HGB 10.8* 10.7*  HCT 35.0* 34.7*  MCV 86.8 86.3  PLT 216 761   Basic Metabolic Panel  Recent Labs  07/03/15 0150  07/04/15 0335  NA 139 137  K 3.5 3.8  CL 104 106  CO2 23 20*  GLUCOSE 102* 127*  BUN 13 12  CREATININE 1.38* 1.07*  CALCIUM 8.1* 7.9*  MG 1.4*  --   PHOS 2.8  --    Liver Function Tests  Recent Labs  07/03/15 0150  AST 30  ALT 19  ALKPHOS 57  BILITOT 0.5  PROT 4.9*  ALBUMIN 1.7*   Cardiac Enzymes  Recent Labs  07/02/15 1315 07/02/15 2043 07/03/15 0150 07/03/15 0740  CKTOTAL 37*  --   --   --   TROPONINI <0.03 <0.03 <0.03 0.03   Thyroid Function Tests  Recent Labs  07/03/15 0438  TSH 0.363    TELE  afib at rate of 90-100s  Radiology/Studiesno new studies.  Echo: 10/13 Study Conclusions  - Left ventricle: The cavity size was normal. Wall thickness was increased in a pattern of moderate to severe LVH. Systolic function was normal. The estimated ejection fraction was in the range of 50% to 55%. Wall motion was normal; there were no regional wall motion abnormalities. - Aortic valve: Transvalvular velocity was within the normal range. There  was no stenosis. There was no regurgitation. - Mitral valve: There was trivial regurgitation. - Left atrium: The atrium was severely dilated. - Right ventricle: The cavity size was normal. Wall thickness was normal. Systolic function was normal. - Right atrium: The atrium was severely dilated. - Atrial septum: No defect or patent foramen ovale was identified. - Tricuspid valve: There was moderate regurgitation. - Pulmonary arteries: PA peak pressure: 37 mm Hg (S). - Inferior vena cava: The vessel was normal in size. The respirophasic diameter changes were in the normal range (>= 50%), consistent with normal central venous pressure.  Impressions:  - Massively dilated atria with moderate to severe LVH and relatively low voltage on EKG concerning for Amyloydosis.   ASSESSMENT AND PLAN Principal Problem:   1. Atrial fibrillation with RVR ([place])--  Likely related to UTI/bacteriemia &  dehydration. - New onset.CHADSVASC score of 4 (age, sex and HTN).  - I truthfully do not feel that she is a good candidate for anticoagulation due to deconditioning/malnutrition, age & dementia -- would use ASA alone.  -  OK to stop IV Heparin as this is likely not new onset given LA dilation. - Cardizem gtt at rate of 11ml/hr, rate in 90-100s.  - can convert to 120 mg PO BID.  Hypertrophic CM on Echo - ? Amyloidosis - in absence of other suggestive features, would probably consider this to be related to long-standing HTN & no infiltrative disease.  Given age, co-morbiditis, would probably not consider fat pad biopsy. No sign of diastolic HF - but would not tolerate hypovolemia well either. Massive atrial dilation would suggest Afib is likely to be persistent vs. Permanent & unlikely to be able to establish NSR.  2. Failure to thrive - She is not eating and drinking well for the past 3 weeks. She is clearly dehydrated. Cr improved. Will increase IV fluid rate to 47ml/hr.   3. AKI - Cr improved further to 1.07  4. HTN - Held amlodipine on admission due to hypotension. - continue to hold - Last BP reading 119/71. - Improving with hydration.   5. Bacteremia - expect that it is related to UTI - 1/2 blood cultures growing Gram Variable Rods  - On empiric antibiotics   Otherwise per primary-- Active Problems:   MALNUTRITION   Chronic pain   General weakness   SIRS (systemic inflammatory response syndrome) (HCC)   Severe dehydration   Lactic acidosis     Signed, Bhagat,Bhavinkumar PA-C Pager [phone]   I have seen, examined and evaluated the patient this PM along with Mr. Cur[place].  After reviewing all the available data and chart,  I agree with his findings, examination as well as impression recommendations.  I have edited the note above with exam findings & recommendations.  Convert to PO Diltiazem 120 mg bid, OK to use ASA only for CVA prophylaxis. Continue to Rx FTT.  Will  most likely need SNF on DC. Rx UTI / SIRS    Nana [place]el[place], Leonie [place], M.D., M.S. Interventional Cardiologist   Pager # [phone]

## 2015-07-04 NOTE — Progress Notes (Signed)
ANTICOAGULATION CONSULT NOTE - Follow Up Consult  Pharmacy Consult for heparin Indication: atrial fibrillation  No Known Allergies  Patient Measurements: Height: 5' 1.81" (157 cm) (10/30/2014) Weight: 117 lb 8.1 oz (53.3 kg) IBW/kg (Calculated) : 49.67  Vital Signs: Temp: 98.6 F (37 C) (10/14 0755) Temp Source: Oral (10/14 0755) BP: 119/71 mmHg (10/14 0755) Pulse Rate: 100 (10/14 0755)  Labs:  Recent Labs  07/02/15 1315 07/02/15 2043 07/03/15 0150 07/03/15 0740 07/03/15 1015 07/04/15 0335  HGB 12.0  --  10.8*  --   --  10.7*  HCT 39.0  --  35.0*  --   --  34.7*  PLT 246  --  216  --   --  221  HEPARINUNFRC  --   --  0.15*  --  0.33 0.28*  CREATININE 1.64*  --  1.38*  --   --  1.07*  CKTOTAL 37*  --   --   --   --   --   TROPONINI <0.03 <0.03 <0.03 0.03  --   --     Estimated Creatinine Clearance: 28.5 mL/min (by C-G formula based on Cr of 1.07).  Assessment: 81 yo F presenting with confusion, weakness, and lack of appetite. New onset a fib in ER.  Pt continues on heparin and diltiazem infusion.  Heparin level is slightly below goal.  Will increase slightly.  Hgb is stable at 10.7.  Renal function has improved with hydration.  Goal of Therapy:  Heparin level 0.3-0.7 units/ml Monitor platelets by anticoagulation protocol: Yes   Plan:  Increase heparin 950 units/hr Daily heparin level, CBC Monitor for s/sx of bleeding F/U long-term AC plans  [employer], Pharm.D., BCPS Clinical Pharmacist Pager [phone] 07/04/2015 8:50 AM

## 2015-07-04 NOTE — Progress Notes (Signed)
ANTIBIOTIC CONSULT NOTE - INITIAL  Pharmacy Consult for Vancomycin and Zosyn  Indication: bacteremia  No Known Allergies  Patient Measurements: Height: 5' 1.81" (157 cm) (10/30/2014) Weight: 117 lb 8.1 oz (53.3 kg) IBW/kg (Calculated) : 49.67  Vital Signs: Temp: 98.5 F (36.9 C) (10/14 0355) Temp Source: Oral (10/14 0355) BP: 138/74 mmHg (10/14 0355) Pulse Rate: 94 (10/14 0355) Intake/Output from previous day: 10/13 0701 - 10/14 0700 In: 1565 [P.O.:700; I.V.:865] Out: 100 [Stool:100] Intake/Output from this shift: Total I/O In: 865 [I.V.:865] Out: 100 [Stool:100]  Labs:  Recent Labs  07/02/15 1315 07/03/15 0150 07/04/15 0335  WBC 15.8* 11.8* 13.1*  HGB 12.0 10.8* 10.7*  PLT 246 216 221  CREATININE 1.64* 1.38* 1.07*   Estimated Creatinine Clearance: 28.5 mL/min (by C-G formula based on Cr of 1.07). No results for input(s): VANCOTROUGH, VANCOPEAK, VANCORANDOM, GENTTROUGH, GENTPEAK, GENTRANDOM, TOBRATROUGH, TOBRAPEAK, TOBRARND, AMIKACINPEAK, AMIKACINTROU, AMIKACIN in the last 72 hours.   Microbiology: Recent Results (from the past 720 hour(s))  Blood culture (routine x 2)     Status: None (Preliminary result)   Collection Time: 07/02/15  1:15 PM  Result Value Ref Range Status   Specimen Description BLOOD LEFT ARM  Final   Special Requests BOTTLES DRAWN AEROBIC AND ANAEROBIC 5CC  Final   Culture  Setup Time   Final    GRAM VARIABLE ROD AEROBIC BOTTLE ONLY CRITICAL RESULT CALLED TO, READ BACK BY AND VERIFIED WITH: ALLISON@0526  07/04/15 MKELLY    Culture NO GROWTH 1 DAY  Final   Report Status PENDING  Incomplete  Rapid strep screen     Status: None   Collection Time: 07/02/15  3:19 PM  Result Value Ref Range Status   Streptococcus, Group A Screen (Direct) NEGATIVE NEGATIVE Final    Comment: (NOTE) A Rapid Antigen test may result negative if the antigen level in the sample is below the detection level of this test. The FDA has not cleared this test as a  stand-alone test therefore the rapid antigen negative result has reflexed to a Group A Strep culture.   Blood culture (routine x 2)     Status: None (Preliminary result)   Collection Time: 07/02/15  3:47 PM  Result Value Ref Range Status   Specimen Description BLOOD RIGHT HAND  Final   Special Requests BOTTLES DRAWN AEROBIC ONLY 1CC  Final   Culture NO GROWTH < 24 HOURS  Final   Report Status PENDING  Incomplete  MRSA PCR Screening     Status: None   Collection Time: 07/02/15  9:23 PM  Result Value Ref Range Status   MRSA by PCR NEGATIVE NEGATIVE Final    Comment:        The GeneXpert MRSA Assay (FDA approved for NASAL specimens only), is one component of a comprehensive MRSA colonization surveillance program. It is not intended to diagnose MRSA infection nor to guide or monitor treatment for MRSA infections.   C difficile quick scan w PCR reflex     Status: None   Collection Time: 07/03/15  1:35 PM  Result Value Ref Range Status   C Diff antigen NEGATIVE NEGATIVE Final   C Diff toxin NEGATIVE NEGATIVE Final   C Diff interpretation Negative for toxigenic C. difficile  Final   Assessment: 81 y.o. female with 1/2 blood cultures growing Gram Variable Rods for empiric antibiotics  Goal of Therapy:  Vancomycin trough level 15-20 mcg/ml  Plan:  Vancomycin 1 g IV now, then 500 mg IV q24h Zosyn 3.375 g  IV q8h    Caryl Pina 07/04/2015,5:37 AM

## 2015-07-04 NOTE — Telephone Encounter (Signed)
She is in the hospital and should follow up after discharge.  Thanks.

## 2015-07-04 NOTE — Progress Notes (Signed)
[facility] PROGRESS NOTE   [name] UUV:[id number] DOB: 1925/01/06 DOA: 07/02/2015 PCP: Binnie Rail, MD  HPI/Subjective: Denies any chest pain, denies any shortness of breath. Heart rate is more controlled now.  Assessment/Plan: Principal Problem:   Atrial fibrillation with RVR (HCC) Active Problems:   MALNUTRITION   Chronic pain   General weakness   SIRS (systemic inflammatory response syndrome) (HCC)   Severe dehydration   Atrial fibrillation (HCC)   Lactic acidosis   AKI (acute kidney injury) ([place])   HTN (hypertension), benign   Failure to thrive in adult   Protein-calorie malnutrition, severe   Atrial fibrillation with RVR ([place]) -Patient presented to the hospital with heart rate of 144, EKG showed A. fib with RVR. -Started on Cardizem drip in the ED, continued. TSH is normal -Discussed previously was Dr. Ellyn Hack, patient likely is poor candidate for anticoagulation, can probably go home on LD aspirin -CHADVAS2 is 3.   MALNUTRITION Albumin is 1.7 and prealbumin is 6.1, severe protein energy malnutrition. Dietitian to evaluate.  General weakness- Suspected secondary to malnutrition and dehydration. Will hydrate and had physical therapy to eval and treat.   Severe dehydration-Providing patient with gentle IVF hydration. Will need to assess to see if altered mental status improves. As there are no focal deficits seen on physical exam and CT scan was negative.   Lactic acidosis- elevated at 2.78. Suspected related to the patient's severe dehydration, but have not been able to rule out a possible infectionunderlying at this time. Will recheck lactic acid levels in a.m.  SIRS (systemic inflammatory response syndrome) ([place])- Patient was seen have elevated white blood cell count with an elevated lactic acid level. Obtain cultures of blood and UA. UA was negative. Blood cultures are pending. No specific source of infection seen at this time . Will monitor  clinically for now unless prompted to add on antibiotics. Would consider empirically adding on antibiotics and patient developed fever greater than 100.5 degrees Fahrenheit.   Acute kidney injury patient's baseline creatinine is seen to be around 1.23.  On admission her hospitalization today was elevated at 1.64 suspected secondary to severe dehydration Started on IV fluids, creatinine is down to 1.0, discontinue IV fluids as she is eating.  Chronic pain - continued some of patient's home medications this time  Code Status: DNR Family Communication: Plan discussed with the patient. Disposition Plan: Remains inpatient Diet: Diet Heart Room service appropriate?: Yes; Fluid consistency:: Thin  Consultants:  Cardiology  Procedures:  None  Antibiotics:  None   Objective: Filed Vitals:   07/04/15 0755  BP: 119/71  Pulse: 100  Temp: 98.6 F (37 C)  Resp: 18    Intake/Output Summary (Last 24 hours) at 07/04/15 1135 Last data filed at 07/04/15 0500  Gross per 24 hour  Intake   1225 ml  Output    100 ml  Net   1125 ml   Filed Weights   07/03/15 0400 07/04/15 0355  Weight: 53.343 kg (117 lb 9.6 oz) 53.3 kg (117 lb 8.1 oz)    Exam: General: Alert and awake, oriented x3, not in any acute distress. HEENT: anicteric sclera, pupils reactive to light and accommodation, EOMI CVS: S1-S2 clear, no murmur rubs or gallops Chest: clear to auscultation bilaterally, no wheezing, rales or rhonchi Abdomen: soft nontender, nondistended, normal bowel sounds, no organomegaly Extremities: no cyanosis, clubbing or edema noted bilaterally Neuro: Cranial nerves II-XII intact, no focal neurological deficits  Data Reviewed: Basic Metabolic Panel:  Recent Labs Lab  07/02/15 1315 07/03/15 0150 07/04/15 0335  NA 135 139 137  K 3.7 3.5 3.8  CL 97* 104 106  CO2 23 23 20*  GLUCOSE 99 102* 127*  BUN 18 13 12   CREATININE 1.64* 1.38* 1.07*  CALCIUM 8.7* 8.1* 7.9*  MG  --  1.4*  --     PHOS  --  2.8  --    Liver Function Tests:  Recent Labs Lab 07/03/15 0150  AST 30  ALT 19  ALKPHOS 57  BILITOT 0.5  PROT 4.9*  ALBUMIN 1.7*   No results for input(s): LIPASE, AMYLASE in the last 168 hours. No results for input(s): AMMONIA in the last 168 hours. CBC:  Recent Labs Lab 07/02/15 1315 07/03/15 0150 07/04/15 0335  WBC 15.8* 11.8* 13.1*  HGB 12.0 10.8* 10.7*  HCT 39.0 35.0* 34.7*  MCV 87.1 86.8 86.3  PLT 246 216 221   Cardiac Enzymes:  Recent Labs Lab 07/02/15 1315 07/02/15 2043 07/03/15 0150 07/03/15 0740  CKTOTAL 37*  --   --   --   TROPONINI <0.03 <0.03 <0.03 0.03   BNP (last 3 results) No results for input(s): BNP in the last 8760 hours.  ProBNP (last 3 results) No results for input(s): PROBNP in the last 8760 hours.  CBG: No results for input(s): GLUCAP in the last 168 hours.  Micro Recent Results (from the past 240 hour(s))  Blood culture (routine x 2)     Status: None (Preliminary result)   Collection Time: 07/02/15  1:15 PM  Result Value Ref Range Status   Specimen Description BLOOD LEFT ARM  Final   Special Requests BOTTLES DRAWN AEROBIC AND ANAEROBIC 5CC  Final   Culture  Setup Time   Final    GRAM VARIABLE ROD AEROBIC BOTTLE ONLY CRITICAL RESULT CALLED TO, READ BACK BY AND VERIFIED WITH: ALLISON@0526  07/04/15 MKELLY    Culture NO GROWTH 1 DAY  Final   Report Status PENDING  Incomplete  Rapid strep screen     Status: None   Collection Time: 07/02/15  3:19 PM  Result Value Ref Range Status   Streptococcus, Group A Screen (Direct) NEGATIVE NEGATIVE Final    Comment: (NOTE) A Rapid Antigen test may result negative if the antigen level in the sample is below the detection level of this test. The FDA has not cleared this test as a stand-alone test therefore the rapid antigen negative result has reflexed to a Group A Strep culture.   Culture, Group A Strep     Status: None   Collection Time: 07/02/15  3:19 PM  Result Value Ref  Range Status   Strep A Culture Negative  Final    Comment: (NOTE) Performed At: [facility] [place], [place] [id number] Lindon Romp MD BS:[id number]   Blood culture (routine x 2)     Status: None (Preliminary result)   Collection Time: 07/02/15  3:47 PM  Result Value Ref Range Status   Specimen Description BLOOD RIGHT HAND  Final   Special Requests BOTTLES DRAWN AEROBIC ONLY 1CC  Final   Culture NO GROWTH < 24 HOURS  Final   Report Status PENDING  Incomplete  MRSA PCR Screening     Status: None   Collection Time: 07/02/15  9:23 PM  Result Value Ref Range Status   MRSA by PCR NEGATIVE NEGATIVE Final    Comment:        The GeneXpert MRSA Assay (FDA approved for NASAL specimens only), is one  component of a comprehensive MRSA colonization surveillance program. It is not intended to diagnose MRSA infection nor to guide or monitor treatment for MRSA infections.   C difficile quick scan w PCR reflex     Status: None   Collection Time: 07/03/15  1:35 PM  Result Value Ref Range Status   C Diff antigen NEGATIVE NEGATIVE Final   C Diff toxin NEGATIVE NEGATIVE Final   C Diff interpretation Negative for toxigenic C. difficile  Final     Studies: Dg Chest 2 View  07/02/2015  CLINICAL DATA:  Weakness and malaise ; atrial fibrillation EXAM: CHEST  2 VIEW COMPARISON:  June 03, 2015 FINDINGS: There is no edema or consolidation. There is slight scarring in the bases. Heart is upper normal in size with pulmonary vascularity within normal limits. There is calcification in the mitral annulus. There is a moderate-sized hiatal hernia. No adenopathy. Bones are diffusely osteoporotic. There is stable anterior wedging of the T12 and L1 vertebral bodies. IMPRESSION: No edema or consolidation. Bones osteoporotic. Heart is upper normal in size. There is a moderate hiatal hernia. There is extensive mitral annulus calcification. Electronically Signed   By: Lowella Grip  III M.D.   On: 07/02/2015 13:40   Ct Head Wo Contrast  07/02/2015  CLINICAL DATA:  Failure to thrive.  Fall, fatigue, hallucinations. EXAM: CT HEAD WITHOUT CONTRAST CT CERVICAL SPINE WITHOUT CONTRAST TECHNIQUE: Multidetector CT imaging of the head and cervical spine was performed following the standard protocol without intravenous contrast. Multiplanar CT image reconstructions of the cervical spine were also generated. COMPARISON:  None. FINDINGS: CT HEAD FINDINGS There is atrophy and chronic small vessel disease changes. No acute intracranial abnormality. Specifically, no hemorrhage, hydrocephalus, mass lesion, acute infarction, or significant intracranial injury. No acute calvarial abnormality. CT CERVICAL SPINE FINDINGS Normal alignment. Prevertebral soft tissues are normal. Mild diffuse degenerative facet disease bilaterally. Slight disc space narrowing at C5-6 and C6-7. No fracture. No epidural or paraspinal hematoma. IMPRESSION: No acute intracranial abnormality. Atrophy, chronic small vessel disease. No acute bony abnormality in the cervical spine. Electronically Signed   By: Rolm Baptise M.D.   On: 07/02/2015 14:57   Ct Cervical Spine Wo Contrast  07/02/2015  CLINICAL DATA:  Failure to thrive.  Fall, fatigue, hallucinations. EXAM: CT HEAD WITHOUT CONTRAST CT CERVICAL SPINE WITHOUT CONTRAST TECHNIQUE: Multidetector CT imaging of the head and cervical spine was performed following the standard protocol without intravenous contrast. Multiplanar CT image reconstructions of the cervical spine were also generated. COMPARISON:  None. FINDINGS: CT HEAD FINDINGS There is atrophy and chronic small vessel disease changes. No acute intracranial abnormality. Specifically, no hemorrhage, hydrocephalus, mass lesion, acute infarction, or significant intracranial injury. No acute calvarial abnormality. CT CERVICAL SPINE FINDINGS Normal alignment. Prevertebral soft tissues are normal. Mild diffuse degenerative facet  disease bilaterally. Slight disc space narrowing at C5-6 and C6-7. No fracture. No epidural or paraspinal hematoma. IMPRESSION: No acute intracranial abnormality. Atrophy, chronic small vessel disease. No acute bony abnormality in the cervical spine. Electronically Signed   By: Rolm Baptise M.D.   On: 07/02/2015 14:57    Scheduled Meds: . aspirin EC  81 mg Oral Daily  . feeding supplement  1 Container Oral TID BM  . feeding supplement (ENSURE ENLIVE)  237 mL Oral TID BM  . multivitamin with minerals  1 tablet Oral Daily  . off the beat book   Does not apply Once  . pantoprazole  40 mg Oral Daily  . piperacillin-tazobactam (ZOSYN)  IV  3.375 g Intravenous 3 times per day  . saccharomyces boulardii  250 mg Oral BID  . sertraline  100 mg Oral Daily  . sodium chloride  3 mL Intravenous Q12H  . [START ON 07/05/2015] vancomycin  500 mg Intravenous Q24H   Continuous Infusions: . sodium chloride 75 mL/hr at 07/03/15 1728  . diltiazem (CARDIZEM) infusion 10 mg/hr (07/04/15 0332)  . heparin 950 Units/hr (07/04/15 0940)       Time spent: 35 minutes    [facility]  [facility] Pager [phone] If 7PM-7AM, please contact night-coverage at [email or web address], password [facility] 07/04/2015, 11:35 AM  LOS: 2 days

## 2015-07-04 NOTE — Telephone Encounter (Signed)
Spoke with daughter.  She will make sure she calls to make follow up as soon as patient is discharged.

## 2015-07-04 NOTE — Progress Notes (Addendum)
Pt pulled out flexi seal and refused to have it put back in. Pt educated on the importance of protecting her skin. Will cont to monitor pt.

## 2015-07-05 LAB — CBC
HEMATOCRIT: 33.5 % — AB (ref 36.0–46.0)
HEMOGLOBIN: 10.7 g/dL — AB (ref 12.0–15.0)
MCH: 27.3 pg (ref 26.0–34.0)
MCHC: 31.9 g/dL (ref 30.0–36.0)
MCV: 85.5 fL (ref 78.0–100.0)
Platelets: 204 10*3/uL (ref 150–400)
RBC: 3.92 MIL/uL (ref 3.87–5.11)
RDW: 15.4 % (ref 11.5–15.5)
WBC: 13.8 10*3/uL — AB (ref 4.0–10.5)

## 2015-07-05 LAB — CULTURE, BLOOD (ROUTINE X 2)

## 2015-07-05 LAB — FECAL LACTOFERRIN, QUANT: Fecal Lactoferrin: NEGATIVE

## 2015-07-05 LAB — GLUCOSE, CAPILLARY: Glucose-Capillary: 111 mg/dL — ABNORMAL HIGH (ref 65–99)

## 2015-07-05 LAB — HEPARIN LEVEL (UNFRACTIONATED): Heparin Unfractionated: 0.31 IU/mL (ref 0.30–0.70)

## 2015-07-05 MED ORDER — DILTIAZEM HCL 60 MG PO TABS
120.0000 mg | ORAL_TABLET | Freq: Two times a day (BID) | ORAL | Status: DC
  Administered 2015-07-05 – 2015-07-07 (×5): 120 mg via ORAL
  Filled 2015-07-05 (×6): qty 2

## 2015-07-05 NOTE — Progress Notes (Addendum)
CSW met with patient in room and attempted to reach her daughter Mitzi Davenport to discuss current SNF bed offers.  Message left for daughter and awaiting return call.  Patient noted to be alert and oriented to person and place only; very pleasant lady.  Unable to provide her with bed offers as she defers to her daughter.  Lorie Phenix. Pauline Good, [place]  (weekend coverage)

## 2015-07-05 NOTE — Progress Notes (Signed)
[facility] PROGRESS NOTE   [name] ZOX:[id number] DOB: [birth year] DOA: 07/02/2015 PCP: Binnie Rail, MD  HPI/Subjective: Seen while she was eating breakfast, not eating much reporting she does not have appetite. Heart rate is about 100-105, per cardiology recommendation will switch to oral Cardizem and discontinue IV heparin  Assessment/Plan: Principal Problem:   Atrial fibrillation with RVR (HCC) Active Problems:   MALNUTRITION   Chronic pain   General weakness   SIRS (systemic inflammatory response syndrome) (HCC)   Severe dehydration   Atrial fibrillation (HCC)   Lactic acidosis   AKI (acute kidney injury) ([place])   HTN (hypertension), benign   Failure to thrive in adult   Protein-calorie malnutrition, severe   Persistent atrial fibrillation (HCC)   Atrial fibrillation with RVR ([place]) -Patient presented to the hospital with heart rate of 144, EKG showed A. fib with RVR. -Started on Cardizem drip in the ED, continued. TSH is normal -Discussed previously was Dr. Ellyn Hack, patient likely is poor candidate for anticoagulation, can probably go home on LD aspirin -CHADVAS2 is 3. -Per cardiology recommendation discontinue Cardizem and heparin drips and switch oral Cardizem and low-dose aspirin.   MALNUTRITION, failure to thrive in adult Albumin is 1.7 and prealbumin is 6.1, severe protein energy malnutrition. Dietitian to evaluate.  General weakness- Suspected secondary to malnutrition and dehydration. Will hydrate and had physical therapy to eval and treat.   Severe dehydration-Providing patient with gentle IVF hydration. Will need to assess to see if altered mental status improves. As there are no focal deficits seen on physical exam and CT scan was negative.   Lactic acidosis- elevated at 2.78. Suspected related to the patient's severe dehydration, but have not been able to rule out a possible infectionunderlying at this time. Will recheck lactic acid levels  in a.m.  SIRS (systemic inflammatory response syndrome) ([place])- Patient was seen have elevated white blood cell count with an elevated lactic acid level. Obtain cultures of blood and UA. UA was negative. Blood cultures are pending. No specific source of infection seen at this time . Will monitor clinically for now unless prompted to add on antibiotics. Would consider empirically adding on antibiotics and patient developed fever greater than 100.5 degrees Fahrenheit.   Acute kidney injury patient's baseline creatinine is seen to be around 1.23.  On admission her hospitalization today was elevated at 1.64 suspected secondary to severe dehydration Started on IV fluids, creatinine is down to 1.0, discontinue IV fluids as she is eating.  Chronic pain - continued some of patient's home medications this time.  Positive blood culture 1/2 blood culture showed gram variable rods, started on vancomycin and Zosyn last night. Has some leukocytosis, urine looks okay, CXR without consolidation.  Code Status: DNR Family Communication: Plan discussed with the patient. Disposition Plan: Remains inpatient Diet: Diet Heart Room service appropriate?: Yes; Fluid consistency:: Thin  Consultants:  Cardiology  Procedures:  None  Antibiotics:  None   Objective: Filed Vitals:   07/05/15 0030  BP: 116/75  Pulse: 104  Temp: 98.7 F (37.1 C)  Resp: 19    Intake/Output Summary (Last 24 hours) at 07/05/15 1043 Last data filed at 07/05/15 0800  Gross per 24 hour  Intake 1261.18 ml  Output    350 ml  Net 911.18 ml   Filed Weights   07/03/15 0400 07/04/15 0355  Weight: 53.343 kg (117 lb 9.6 oz) 53.3 kg (117 lb 8.1 oz)    Exam: General: Alert and awake, oriented x3, not in any  acute distress. HEENT: anicteric sclera, pupils reactive to light and accommodation, EOMI CVS: S1-S2 clear, no murmur rubs or gallops Chest: clear to auscultation bilaterally, no wheezing, rales or rhonchi Abdomen:  soft nontender, nondistended, normal bowel sounds, no organomegaly Extremities: no cyanosis, clubbing or edema noted bilaterally Neuro: Cranial nerves II-XII intact, no focal neurological deficits  Data Reviewed: Basic Metabolic Panel:  Recent Labs Lab 07/02/15 1315 07/03/15 0150 07/04/15 0335  NA 135 139 137  K 3.7 3.5 3.8  CL 97* 104 106  CO2 23 23 20*  GLUCOSE 99 102* 127*  BUN 18 13 12   CREATININE 1.64* 1.38* 1.07*  CALCIUM 8.7* 8.1* 7.9*  MG  --  1.4*  --   PHOS  --  2.8  --    Liver Function Tests:  Recent Labs Lab 07/03/15 0150  AST 30  ALT 19  ALKPHOS 57  BILITOT 0.5  PROT 4.9*  ALBUMIN 1.7*   No results for input(s): LIPASE, AMYLASE in the last 168 hours. No results for input(s): AMMONIA in the last 168 hours. CBC:  Recent Labs Lab 07/02/15 1315 07/03/15 0150 07/04/15 0335 07/05/15 0333  WBC 15.8* 11.8* 13.1* 13.8*  HGB 12.0 10.8* 10.7* 10.7*  HCT 39.0 35.0* 34.7* 33.5*  MCV 87.1 86.8 86.3 85.5  PLT 246 216 221 204   Cardiac Enzymes:  Recent Labs Lab 07/02/15 1315 07/02/15 2043 07/03/15 0150 07/03/15 0740  CKTOTAL 37*  --   --   --   TROPONINI <0.03 <0.03 <0.03 0.03   BNP (last 3 results) No results for input(s): BNP in the last 8760 hours.  ProBNP (last 3 results) No results for input(s): PROBNP in the last 8760 hours.  CBG: No results for input(s): GLUCAP in the last 168 hours.  Micro Recent Results (from the past 240 hour(s))  Blood culture (routine x 2)     Status: None (Preliminary result)   Collection Time: 07/02/15  1:15 PM  Result Value Ref Range Status   Specimen Description BLOOD LEFT ARM  Final   Special Requests BOTTLES DRAWN AEROBIC AND ANAEROBIC 5CC  Final   Culture  Setup Time   Final    GRAM VARIABLE ROD AEROBIC BOTTLE ONLY CRITICAL RESULT CALLED TO, READ BACK BY AND VERIFIED WITH: ALLISON@0526  07/04/15 MKELLY    Culture NO GROWTH 2 DAYS  Final   Report Status PENDING  Incomplete  Rapid strep screen      Status: None   Collection Time: 07/02/15  3:19 PM  Result Value Ref Range Status   Streptococcus, Group A Screen (Direct) NEGATIVE NEGATIVE Final    Comment: (NOTE) A Rapid Antigen test may result negative if the antigen level in the sample is below the detection level of this test. The FDA has not cleared this test as a stand-alone test therefore the rapid antigen negative result has reflexed to a Group A Strep culture.   Culture, Group A Strep     Status: None   Collection Time: 07/02/15  3:19 PM  Result Value Ref Range Status   Strep A Culture Negative  Final    Comment: (NOTE) Performed At: [facility] [place], [place] [id number] Lindon Romp MD [id number]   Blood culture (routine x 2)     Status: None (Preliminary result)   Collection Time: 07/02/15  3:47 PM  Result Value Ref Range Status   Specimen Description BLOOD RIGHT HAND  Final   Special Requests BOTTLES DRAWN AEROBIC ONLY [place]  Final  Culture NO GROWTH 2 DAYS  Final   Report Status PENDING  Incomplete  MRSA PCR Screening     Status: None   Collection Time: 07/02/15  9:23 PM  Result Value Ref Range Status   MRSA by PCR NEGATIVE NEGATIVE Final    Comment:        The GeneXpert MRSA Assay (FDA approved for NASAL specimens only), is one component of a comprehensive MRSA colonization surveillance program. It is not intended to diagnose MRSA infection nor to guide or monitor treatment for MRSA infections.   C difficile quick scan w PCR reflex     Status: None   Collection Time: 07/03/15  1:35 PM  Result Value Ref Range Status   C Diff antigen NEGATIVE NEGATIVE Final   C Diff toxin NEGATIVE NEGATIVE Final   C Diff interpretation Negative for toxigenic C. difficile  Final     Studies: No results found.  Scheduled Meds: . aspirin EC  81 mg Oral Daily  . feeding supplement  1 Container Oral TID BM  . feeding supplement (ENSURE ENLIVE)  237 mL Oral TID BM  . multivitamin with  minerals  1 tablet Oral Daily  . pantoprazole  40 mg Oral Daily  . piperacillin-tazobactam (ZOSYN)  IV  3.375 g Intravenous 3 times per day  . saccharomyces boulardii  250 mg Oral BID  . sertraline  100 mg Oral Daily  . sodium chloride  3 mL Intravenous Q12H  . vancomycin  500 mg Intravenous Q24H   Continuous Infusions: . sodium chloride 10 mL/hr at 07/04/15 1327  . diltiazem (CARDIZEM) infusion 5 mg/hr (07/05/15 0400)  . heparin 950 Units/hr (07/05/15 0400)       Time spent: 35 minutes    [facility]  [facility] Pager [phone] If 7PM-7AM, please contact night-coverage at [email or web address], password [facility] 07/05/2015, 10:43 AM  LOS: 3 days

## 2015-07-05 NOTE — Progress Notes (Signed)
Daughter [name] called back to state that she and her husband could meet with CSW tomorrow at noon if possible rather than Monday afternoon.  CSW left message for Sunday CSW coverage to call daughter in the morning to confirm if she is able to meet. Lorie Phenix. Pauline Good, [place] (weekend coverage)

## 2015-07-05 NOTE — Progress Notes (Signed)
Patient's daughter Kathleen Argue called back.  [phone]. She stated that she,her husband and another family member wanted to meet with CSW on Monday at 3pm if possible to review d/c plan and SNF options.  Family will be in patient's room at that time.  If this is not acceptable on Monday- she requested that weekday CSW call her.  Lorie Phenix. Pauline Good, [place] (weekend coverage)

## 2015-07-06 LAB — BASIC METABOLIC PANEL
ANION GAP: 11 (ref 5–15)
BUN: 7 mg/dL (ref 6–20)
CALCIUM: 8.1 mg/dL — AB (ref 8.9–10.3)
CO2: 23 mmol/L (ref 22–32)
Chloride: 104 mmol/L (ref 101–111)
Creatinine, Ser: 1.13 mg/dL — ABNORMAL HIGH (ref 0.44–1.00)
GFR, EST AFRICAN AMERICAN: 49 mL/min — AB (ref >60)
GFR, EST NON AFRICAN AMERICAN: 42 mL/min — AB (ref >60)
GLUCOSE: 118 mg/dL — AB (ref 65–99)
POTASSIUM: 3.1 mmol/L — AB (ref 3.5–5.1)
SODIUM: 138 mmol/L (ref 135–145)

## 2015-07-06 LAB — CBC
HCT: 34.3 % — ABNORMAL LOW (ref 36.0–46.0)
Hemoglobin: 10.7 g/dL — ABNORMAL LOW (ref 12.0–15.0)
MCH: 26.2 pg (ref 26.0–34.0)
MCHC: 31.2 g/dL (ref 30.0–36.0)
MCV: 83.9 fL (ref 78.0–100.0)
PLATELETS: 223 10*3/uL (ref 150–400)
RBC: 4.09 MIL/uL (ref 3.87–5.11)
RDW: 15.4 % (ref 11.5–15.5)
WBC: 15.4 10*3/uL — AB (ref 4.0–10.5)

## 2015-07-06 MED ORDER — AMOXICILLIN-POT CLAVULANATE 875-125 MG PO TABS
1.0000 | ORAL_TABLET | Freq: Two times a day (BID) | ORAL | Status: DC
  Administered 2015-07-06 – 2015-07-07 (×3): 1 via ORAL
  Filled 2015-07-06 (×3): qty 1

## 2015-07-06 MED ORDER — ENOXAPARIN SODIUM 30 MG/0.3ML ~~LOC~~ SOLN
30.0000 mg | SUBCUTANEOUS | Status: DC
Start: 2015-07-06 — End: 2015-07-07
  Administered 2015-07-06 – 2015-07-07 (×2): 30 mg via SUBCUTANEOUS
  Filled 2015-07-06 (×2): qty 0.3

## 2015-07-06 MED ORDER — POTASSIUM CHLORIDE CRYS ER 20 MEQ PO TBCR
40.0000 meq | EXTENDED_RELEASE_TABLET | Freq: Four times a day (QID) | ORAL | Status: AC
  Administered 2015-07-06 (×2): 40 meq via ORAL
  Filled 2015-07-06 (×2): qty 2

## 2015-07-06 NOTE — Progress Notes (Signed)
CSW met with pt, her daughter and SIL to discuss bed offers.  Pt would like to go to [facility] at d/c.  Unit CSW will continue to follow and facilitate disposition as appropriate.

## 2015-07-06 NOTE — Progress Notes (Signed)
ANTICOAGULATION CONSULT NOTE - Initial Consult  Pharmacy Consult for Lovenox Indication: VTE prophylaxis  No Known Allergies  Patient Measurements: Height: 5' 1.81" (157 cm) (10/30/2014) Weight: 117 lb 8.1 oz (53.3 kg) IBW/kg (Calculated) : 49.67  Vital Signs: Temp: 98.5 F (36.9 C) (10/16 0815) Temp Source: Oral (10/16 0815) BP: 124/63 mmHg (10/16 0500) Pulse Rate: 83 (10/16 0500)  Labs:  Recent Labs  07/04/15 0335 07/05/15 0333 07/06/15 0351  HGB 10.7* 10.7* 10.7*  HCT 34.7* 33.5* 34.3*  PLT 221 204 223  HEPARINUNFRC 0.28* 0.31  --   CREATININE 1.07*  --  1.13*    Estimated Creatinine Clearance: 27 mL/min (by C-G formula based on Cr of 1.13).   Medications:  Scheduled:  . aspirin EC  81 mg Oral Daily  . diltiazem  120 mg Oral Q12H  . enoxaparin (LOVENOX) injection  30 mg Subcutaneous Q24H  . feeding supplement  1 Container Oral TID BM  . feeding supplement (ENSURE ENLIVE)  237 mL Oral TID BM  . multivitamin with minerals  1 tablet Oral Daily  . pantoprazole  40 mg Oral Daily  . piperacillin-tazobactam (ZOSYN)  IV  3.375 g Intravenous 3 times per day  . potassium chloride  40 mEq Oral Q6H  . saccharomyces boulardii  250 mg Oral BID  . sertraline  100 mg Oral Daily  . sodium chloride  3 mL Intravenous Q12H  . vancomycin  500 mg Intravenous Q24H    Assessment: 81 yo F presenting with confusion, weakness, and lack of appetite. New onset a fib in ER.  S/p heparin IV. No anticoagulation PTA. CBC stable. No bleeding noted. Per cardio, recommended low dose aspirin d/t poor candidate for anticoagulation.  Goal of Therapy:  Monitor platelets by anticoagulation protocol: Yes   Plan:  Lovenox 30 mg SQ q24h Monitor for s/sx of bleeding   Joya San, PharmD Clinical Pharmacy Resident Pager # [phone] 07/06/2015 11:16 AM

## 2015-07-06 NOTE — Progress Notes (Signed)
[facility] PROGRESS NOTE   [name] CHE:[id number] DOB: 1925-04-29 DOA: 07/02/2015 PCP: Binnie Rail, MD  HPI/Subjective: Denies significant complaints. Overall feels okay, appetite improved slightly since yesterday.  Assessment/Plan: Principal Problem:   Atrial fibrillation with RVR (HCC) Active Problems:   MALNUTRITION   Chronic pain   General weakness   SIRS (systemic inflammatory response syndrome) (HCC)   Severe dehydration   Atrial fibrillation (HCC)   Lactic acidosis   AKI (acute kidney injury) ([place])   HTN (hypertension), benign   Failure to thrive in adult   Protein-calorie malnutrition, severe   Persistent atrial fibrillation (HCC)   Atrial fibrillation with RVR ([place]) -Patient presented to the hospital with heart rate of 144, EKG showed A. fib with RVR. -Started on Cardizem drip in the ED, continued. TSH is normal -Discussed previously was Dr. Ellyn Hack, patient likely is poor candidate for anticoagulation, can probably go home on LD aspirin -CHADVAS2 is 3. -Per cardiology recommendation discontinue Cardizem and heparin drips and switch oral Cardizem and low-dose aspirin.   MALNUTRITION, failure to thrive in adult Albumin is 1.7 and prealbumin is 6.1, severe protein energy malnutrition. Dietitian to evaluate.  General weakness- Suspected secondary to malnutrition and dehydration. Will hydrate and had physical therapy to eval and treat.   Severe dehydration-Providing patient with gentle IVF hydration. Will need to assess to see if altered mental status improves. As there are no focal deficits seen on physical exam and CT scan was negative.   Lactic acidosis- elevated at 2.78. Suspected related to the patient's severe dehydration, but have not been able to rule out a possible infectionunderlying at this time. Will recheck lactic acid levels in a.m.  SIRS (systemic inflammatory response syndrome) ([place])- Patient was seen have elevated white blood  cell count with an elevated lactic acid level. Obtain cultures of blood and UA. UA was negative. Blood cultures are pending. No specific source of infection seen at this time . Will monitor clinically for now unless prompted to add on antibiotics. Would consider empirically adding on antibiotics and patient developed fever greater than 100.5 degrees Fahrenheit.   Acute kidney injury patient's baseline creatinine is seen to be around 1.23.  On admission her hospitalization today was elevated at 1.64 suspected secondary to severe dehydration Started on IV fluids, creatinine is down to 1.0, discontinue IV fluids as she is eating.  Chronic pain - continued some of patient's home medications this time.  Positive blood culture 1/2 blood culture showed gram variable rods, started on vancomycin and Zosyn last night. Has some leukocytosis, urine looks okay, CXR without consolidation. Blood culture showed corynebacterium diphtheroids, likely contaminants, will discontinue Zosyn and vancomycin. Because of leukocytosis and fever patient had give Augmentin for 3 more days.  Code Status: DNR Family Communication: Plan discussed with the patient. Disposition Plan: Remains inpatient Diet: Diet Heart Room service appropriate?: Yes; Fluid consistency:: Thin  Consultants:  Cardiology  Procedures:  None  Antibiotics:  None   Objective: Filed Vitals:   07/06/15 0815  BP:   Pulse:   Temp: 98.5 F (36.9 C)  Resp:     Intake/Output Summary (Last 24 hours) at 07/06/15 1114 Last data filed at 07/06/15 0940  Gross per 24 hour  Intake 419.67 ml  Output    200 ml  Net 219.67 ml   Filed Weights   07/03/15 0400 07/04/15 0355  Weight: 53.343 kg (117 lb 9.6 oz) 53.3 kg (117 lb 8.1 oz)    Exam: General: Alert and awake, oriented  x3, not in any acute distress. HEENT: anicteric sclera, pupils reactive to light and accommodation, EOMI CVS: S1-S2 clear, no murmur rubs or gallops Chest: clear  to auscultation bilaterally, no wheezing, rales or rhonchi Abdomen: soft nontender, nondistended, normal bowel sounds, no organomegaly Extremities: no cyanosis, clubbing or edema noted bilaterally Neuro: Cranial nerves II-XII intact, no focal neurological deficits  Data Reviewed: Basic Metabolic Panel:  Recent Labs Lab 07/02/15 1315 07/03/15 0150 07/04/15 0335 07/06/15 0351  NA 135 139 137 138  K 3.7 3.5 3.8 3.1*  CL 97* 104 106 104  CO2 23 23 20* 23  GLUCOSE 99 102* 127* 118*  BUN 18 13 12 7   CREATININE 1.64* 1.38* 1.07* 1.13*  CALCIUM 8.7* 8.1* 7.9* 8.1*  MG  --  1.4*  --   --   PHOS  --  2.8  --   --    Liver Function Tests:  Recent Labs Lab 07/03/15 0150  AST 30  ALT 19  ALKPHOS 57  BILITOT 0.5  PROT 4.9*  ALBUMIN 1.7*   No results for input(s): LIPASE, AMYLASE in the last 168 hours. No results for input(s): AMMONIA in the last 168 hours. CBC:  Recent Labs Lab 07/02/15 1315 07/03/15 0150 07/04/15 0335 07/05/15 0333 07/06/15 0351  WBC 15.8* 11.8* 13.1* 13.8* 15.4*  HGB 12.0 10.8* 10.7* 10.7* 10.7*  HCT 39.0 35.0* 34.7* 33.5* 34.3*  MCV 87.1 86.8 86.3 85.5 83.9  PLT 246 216 221 204 223   Cardiac Enzymes:  Recent Labs Lab 07/02/15 1315 07/02/15 2043 07/03/15 0150 07/03/15 0740  CKTOTAL 37*  --   --   --   TROPONINI <0.03 <0.03 <0.03 0.03   BNP (last 3 results) No results for input(s): BNP in the last 8760 hours.  ProBNP (last 3 results) No results for input(s): PROBNP in the last 8760 hours.  CBG:  Recent Labs Lab 07/05/15 2122  GLUCAP 111*    Micro Recent Results (from the past 240 hour(s))  Blood culture (routine x 2)     Status: None   Collection Time: 07/02/15  1:15 PM  Result Value Ref Range Status   Specimen Description BLOOD LEFT ARM  Final   Special Requests BOTTLES DRAWN AEROBIC AND ANAEROBIC 5CC  Final   Culture  Setup Time   Final    GRAM VARIABLE ROD AEROBIC BOTTLE ONLY CRITICAL RESULT CALLED TO, READ BACK BY AND  VERIFIED WITH: ALLISON@0526  07/04/15 MKELLY    Culture   Final    DIPHTHEROIDS(CORYNEBACTERIUM SPECIES) Standardized susceptibility testing for this organism is not available.    Report Status 07/05/2015 FINAL  Final  Rapid strep screen     Status: None   Collection Time: 07/02/15  3:19 PM  Result Value Ref Range Status   Streptococcus, Group A Screen (Direct) NEGATIVE NEGATIVE Final    Comment: (NOTE) A Rapid Antigen test may result negative if the antigen level in the sample is below the detection level of this test. The FDA has not cleared this test as a stand-alone test therefore the rapid antigen negative result has reflexed to a Group A Strep culture.   Culture, Group A Strep     Status: None   Collection Time: 07/02/15  3:19 PM  Result Value Ref Range Status   Strep A Culture Negative  Final    Comment: (NOTE) Performed At: [facility] [address] [place], [place] [id number] Lindon Romp MD JG:[id number]   Blood culture (routine x 2)  Status: None (Preliminary result)   Collection Time: 07/02/15  3:47 PM  Result Value Ref Range Status   Specimen Description BLOOD RIGHT HAND  Final   Special Requests BOTTLES DRAWN AEROBIC ONLY 1CC  Final   Culture NO GROWTH 3 DAYS  Final   Report Status PENDING  Incomplete  MRSA PCR Screening     Status: None   Collection Time: 07/02/15  9:23 PM  Result Value Ref Range Status   MRSA by PCR NEGATIVE NEGATIVE Final    Comment:        The GeneXpert MRSA Assay (FDA approved for NASAL specimens only), is one component of a comprehensive MRSA colonization surveillance program. It is not intended to diagnose MRSA infection nor to guide or monitor treatment for MRSA infections.   C difficile quick scan w PCR reflex     Status: None   Collection Time: 07/03/15  1:35 PM  Result Value Ref Range Status   C Diff antigen NEGATIVE NEGATIVE Final   C Diff toxin NEGATIVE NEGATIVE Final   C Diff interpretation Negative for  toxigenic C. difficile  Final  Stool culture     Status: None (Preliminary result)   Collection Time: 07/03/15  4:29 PM  Result Value Ref Range Status   Specimen Description STOOL  Final   Special Requests NONE  Final   Culture   Final    NO SUSPICIOUS COLONIES, CONTINUING TO HOLD Performed at [facility]    Report Status PENDING  Incomplete     Studies: No results found.  Scheduled Meds: . aspirin EC  81 mg Oral Daily  . diltiazem  120 mg Oral Q12H  . feeding supplement  1 Container Oral TID BM  . feeding supplement (ENSURE ENLIVE)  237 mL Oral TID BM  . multivitamin with minerals  1 tablet Oral Daily  . pantoprazole  40 mg Oral Daily  . piperacillin-tazobactam (ZOSYN)  IV  3.375 g Intravenous 3 times per day  . potassium chloride  40 mEq Oral Q6H  . saccharomyces boulardii  250 mg Oral BID  . sertraline  100 mg Oral Daily  . sodium chloride  3 mL Intravenous Q12H  . vancomycin  500 mg Intravenous Q24H   Continuous Infusions: . sodium chloride 10 mL/hr at 07/04/15 1327       Time spent: 35 minutes    [facility]  [facility] Pager [phone] If 7PM-7AM, please contact night-coverage at [email or web address], password [facility] 07/06/2015, 11:14 AM  LOS: 4 days

## 2015-07-07 LAB — STOOL CULTURE

## 2015-07-07 LAB — CBC
HEMATOCRIT: 34.9 % — AB (ref 36.0–46.0)
Hemoglobin: 10.7 g/dL — ABNORMAL LOW (ref 12.0–15.0)
MCH: 26.1 pg (ref 26.0–34.0)
MCHC: 30.7 g/dL (ref 30.0–36.0)
MCV: 85.1 fL (ref 78.0–100.0)
Platelets: 240 10*3/uL (ref 150–400)
RBC: 4.1 MIL/uL (ref 3.87–5.11)
RDW: 15.6 % — AB (ref 11.5–15.5)
WBC: 16.3 10*3/uL — AB (ref 4.0–10.5)

## 2015-07-07 LAB — BASIC METABOLIC PANEL
ANION GAP: 8 (ref 5–15)
BUN: 7 mg/dL (ref 6–20)
CALCIUM: 8.1 mg/dL — AB (ref 8.9–10.3)
CO2: 23 mmol/L (ref 22–32)
Chloride: 104 mmol/L (ref 101–111)
Creatinine, Ser: 1.17 mg/dL — ABNORMAL HIGH (ref 0.44–1.00)
GFR, EST AFRICAN AMERICAN: 47 mL/min — AB (ref >60)
GFR, EST NON AFRICAN AMERICAN: 40 mL/min — AB (ref >60)
Glucose, Bld: 98 mg/dL (ref 65–99)
POTASSIUM: 4 mmol/L (ref 3.5–5.1)
Sodium: 135 mmol/L (ref 135–145)

## 2015-07-07 LAB — CULTURE, BLOOD (ROUTINE X 2): CULTURE: NO GROWTH

## 2015-07-07 MED ORDER — DILTIAZEM HCL 120 MG PO TABS: 120.0000 mg | ORAL_TABLET | Freq: Two times a day (BID) | ORAL | Status: AC

## 2015-07-07 MED ORDER — AMOXICILLIN-POT CLAVULANATE 875-125 MG PO TABS: 1.0000 | ORAL_TABLET | Freq: Two times a day (BID) | ORAL | Status: AC

## 2015-07-07 MED ORDER — HYDROCODONE-ACETAMINOPHEN 5-325 MG PO TABS: 0.5000 | ORAL_TABLET | Freq: Every day | ORAL | Status: AC | PRN

## 2015-07-07 MED ORDER — ASPIRIN 81 MG PO TBEC: 81.0000 mg | DELAYED_RELEASE_TABLET | Freq: Every day | ORAL | Status: AC

## 2015-07-07 MED ORDER — LORAZEPAM 0.5 MG PO TABS: ORAL_TABLET | ORAL | Status: AC

## 2015-07-07 NOTE — Discharge Summary (Signed)
Physician Discharge Summary  [name] QQP:[id number] DOB: 12/26/1924 DOA: 07/02/2015  PCP: Binnie Rail, MD  Admit date: 07/02/2015 Discharge date: 07/07/2015  Time spent: 40 minutes  Recommendations for Outpatient Follow-up:  1. Follow up with nursing home M.D. 2. New medications including Cardizem and low-dose aspirin. 3. Augmentin for 3 more days  Discharge Diagnoses:  Principal Problem:   Atrial fibrillation with RVR (HCC) Active Problems:   MALNUTRITION   Chronic pain   General weakness   SIRS (systemic inflammatory response syndrome) (HCC)   Severe dehydration   Atrial fibrillation (HCC)   Lactic acidosis   AKI (acute kidney injury) ([place])   HTN (hypertension), benign   Failure to thrive in adult   Protein-calorie malnutrition, severe   Persistent atrial fibrillation [facility])   Discharge Condition: Stable  Diet recommendation: Heart healthy  Filed Weights   07/03/15 0400 07/04/15 0355 07/07/15 0400  Weight: 53.343 kg (117 lb 9.6 oz) 53.3 kg (117 lb 8.1 oz) 56.1 kg (123 lb 10.9 oz)    History of present illness:  Patient is a 81 year old, Caucasian female with past medical history of cervical cancer status post hysterectomy, chronic pain who presents for generalized weakness and lack of appetite that have progressively worsened in the last 3 weeks. Family is present at bedside and provided additional history. The patient was able to live independently prior to these symptoms starting, however when she became unable to stand/walk on her own that's why they brought her into the hospital for evaluation. Family notes in the last 3 weeks that her weight has dropped from around 130 pounds to approximately 113 pounds. They had tried to supplement her meals with Carnation chocolate shakes, but the patient would only take a few sips. She notes that she has no associated symptoms of feeling thirsty and palpitations. Upon arrival to the emergency department she was seen to be in  Afib with initial heart rates in the 110's. The heart rates increased to 140s 150s and a Cardizem drip was started. She denies having any heart issues are complications in the past. Patient denies any chest pain or shortness of breath. She notes pain all over her body and not isolate any specific region. This appears to be her baseline pain in which she lives with. Family also notes all alert she is been somewhat generally confused from time to time with these symptoms. He also reported that she had a couple falls where she likely just slid out of bed. Family states that she had no head trauma or loss of consciousness. Denies any nausea vomiting symptoms.  Hospital Course:   Atrial fibrillation with RVR ([place]) -Patient presented to the hospital with heart rate of 144, EKG showed A. fib with RVR. -Started on Cardizem drip in the ED. TSH is normal -Discussed with Dr. Ellyn Hack, patient likely is poor candidate for anticoagulation, can probably go home on LD aspirin -CHADVAS2 is 3. -On 120 mg of by mouth Cardizem twice a day, low-dose aspirin for CVA prevention.   MALNUTRITION, failure to thrive in adult Albumin is 1.7 and prealbumin is 6.1, severe protein energy malnutrition. Recommended ensure supplements with meals.  General weakness Suspected secondary to malnutrition and dehydration. Will hydrate and had physical therapy to eval and treat.   Severe dehydration-Providing patient with gentle IVF hydration. Will need to assess to see if altered mental status improves. As there are no focal deficits seen on physical exam and CT scan was negative.   Lactic acidosis- elevated at 2.78.  Suspected related to the patient's severe dehydration, but have not been able to rule out a possible infectionunderlying at this time. Will recheck lactic acid levels in a.m.  SIRS (systemic inflammatory response syndrome) (HCC)- -No clear source for infection, patient had some leukocytosis and low-grade  fever. -He initially told this is to be bacteremia but blood culture showing probable contaminants. -Empirically on Augmentin for 3 more days.  Acute kidney injury patient's baseline creatinine is seen to be around 1.23.  On admission her hospitalization today was elevated at 1.64 suspected secondary to severe dehydration Started on IV fluids, creatinine is down to 1.0, discontinue IV fluids as she is eating.  Chronic pain - continued some of patient's home medications this time.  Positive blood culture 1/2 blood culture showed gram variable rods, started on vancomycin and Zosyn last night. Has some leukocytosis, urine looks okay, CXR without consolidation. Blood culture showed corynebacterium diphtheroids, likely contaminants, will discontinue Zosyn and vancomycin. Because of leukocytosis and fever patient had give Augmentin for 3 more days.   Procedures:  None  Consultations:  None  Discharge Exam: Filed Vitals:   07/07/15 0759  BP:   Pulse:   Temp: 99.5 F (37.5 C)  Resp:    General: Alert and awake, oriented x3, not in any acute distress. HEENT: anicteric sclera, pupils reactive to light and accommodation, EOMI CVS: S1-S2 clear, no murmur rubs or gallops Chest: clear to auscultation bilaterally, no wheezing, rales or rhonchi Abdomen: soft nontender, nondistended, normal bowel sounds, no organomegaly Extremities: no cyanosis, clubbing or edema noted bilaterally Neuro: Cranial nerves II-XII intact, no focal neurological deficits  Discharge Instructions   Discharge Instructions    Diet - low sodium heart healthy    Complete by:  As directed      Increase activity slowly    Complete by:  As directed           Current Discharge Medication List    START taking these medications   Details  amoxicillin-clavulanate (AUGMENTIN) 875-125 MG tablet Take 1 tablet by mouth every 12 (twelve) hours.    aspirin EC 81 MG EC tablet Take 1 tablet (81 mg total) by mouth  daily.    diltiazem (CARDIZEM) 120 MG tablet Take 1 tablet (120 mg total) by mouth every 12 (twelve) hours.      CONTINUE these medications which have CHANGED   Details  HYDROcodone-acetaminophen (NORCO/VICODIN) 5-325 MG tablet Take 0.5 tablets by mouth daily as needed for moderate pain or severe pain. Qty: 10 tablet, Refills: 0    LORazepam (ATIVAN) 0.5 MG tablet TAKE 0.5 MG BY MOUTH TWICE DAILY AS NEEDED FOR ANXIETY Qty: 10 tablet, Refills: 0      CONTINUE these medications which have NOT CHANGED   Details  acetaminophen (TYLENOL) 500 MG tablet Take 500-1,000 mg by mouth every 6 (six) hours as needed for mild pain, moderate pain or headache.    gabapentin (NEURONTIN) 300 MG capsule TAKE 1 CAPSULE BY MOUTH TWICE A DAY AND 2 CAPSULES AT BEDTIME AS DIRECTED Qty: 120 capsule, Refills: 0    Multiple Vitamins-Minerals (MULTIVITAMIN ADULTS 50+ PO) Take 1 tablet by mouth daily.    omeprazole (PRILOSEC) 20 MG capsule TAKE 2 CAPSULES BY MOUTH EVERY DAY Qty: 180 capsule, Refills: 3    sertraline (ZOLOFT) 100 MG tablet TAKE 1 TABLET BY MOUTH ONCE EVERY MORNING WITH FOOD Qty: 90 tablet, Refills: 3    vitamin E 400 UNIT capsule Take 400 Units by mouth daily.  STOP taking these medications     amLODipine (NORVASC) 5 MG tablet      traMADol (ULTRAM) 50 MG tablet        No Known Allergies Follow-up Information    Follow up with Binnie Rail, MD In 1 week.   Specialty:  Internal Medicine   Contact information:   [place]  [address] [phone]        The results of significant diagnostics from this hospitalization (including imaging, microbiology, ancillary and laboratory) are listed below for reference.    Significant Diagnostic Studies: Dg Chest 2 View  07/02/2015  CLINICAL DATA:  Weakness and malaise ; atrial fibrillation EXAM: CHEST  2 VIEW COMPARISON:  June 03, 2015 FINDINGS: There is no edema or consolidation. There is slight scarring in the  bases. Heart is upper normal in size with pulmonary vascularity within normal limits. There is calcification in the mitral annulus. There is a moderate-sized hiatal hernia. No adenopathy. Bones are diffusely osteoporotic. There is stable anterior wedging of the T12 and L1 vertebral bodies. IMPRESSION: No edema or consolidation. Bones osteoporotic. Heart is upper normal in size. There is a moderate hiatal hernia. There is extensive mitral annulus calcification. Electronically Signed   By: Lowella Grip III M.D.   On: 07/02/2015 13:40   Ct Head Wo Contrast  07/02/2015  CLINICAL DATA:  Failure to thrive.  Fall, fatigue, hallucinations. EXAM: CT HEAD WITHOUT CONTRAST CT CERVICAL SPINE WITHOUT CONTRAST TECHNIQUE: Multidetector CT imaging of the head and cervical spine was performed following the standard protocol without intravenous contrast. Multiplanar CT image reconstructions of the cervical spine were also generated. COMPARISON:  None. FINDINGS: CT HEAD FINDINGS There is atrophy and chronic small vessel disease changes. No acute intracranial abnormality. Specifically, no hemorrhage, hydrocephalus, mass lesion, acute infarction, or significant intracranial injury. No acute calvarial abnormality. CT CERVICAL SPINE FINDINGS Normal alignment. Prevertebral soft tissues are normal. Mild diffuse degenerative facet disease bilaterally. Slight disc space narrowing at C5-6 and C6-7. No fracture. No epidural or paraspinal hematoma. IMPRESSION: No acute intracranial abnormality. Atrophy, chronic small vessel disease. No acute bony abnormality in the cervical spine. Electronically Signed   By: Rolm Baptise M.D.   On: 07/02/2015 14:57   Ct Cervical Spine Wo Contrast  07/02/2015  CLINICAL DATA:  Failure to thrive.  Fall, fatigue, hallucinations. EXAM: CT HEAD WITHOUT CONTRAST CT CERVICAL SPINE WITHOUT CONTRAST TECHNIQUE: Multidetector CT imaging of the head and cervical spine was performed following the standard  protocol without intravenous contrast. Multiplanar CT image reconstructions of the cervical spine were also generated. COMPARISON:  None. FINDINGS: CT HEAD FINDINGS There is atrophy and chronic small vessel disease changes. No acute intracranial abnormality. Specifically, no hemorrhage, hydrocephalus, mass lesion, acute infarction, or significant intracranial injury. No acute calvarial abnormality. CT CERVICAL SPINE FINDINGS Normal alignment. Prevertebral soft tissues are normal. Mild diffuse degenerative facet disease bilaterally. Slight disc space narrowing at C5-6 and C6-7. No fracture. No epidural or paraspinal hematoma. IMPRESSION: No acute intracranial abnormality. Atrophy, chronic small vessel disease. No acute bony abnormality in the cervical spine. Electronically Signed   By: Rolm Baptise M.D.   On: 07/02/2015 14:57    Microbiology: Recent Results (from the past 240 hour(s))  Blood culture (routine x 2)     Status: None   Collection Time: 07/02/15  1:15 PM  Result Value Ref Range Status   Specimen Description BLOOD LEFT ARM  Final   Special Requests BOTTLES DRAWN AEROBIC AND ANAEROBIC 5CC  Final   Culture  Setup Time   Final    GRAM VARIABLE ROD AEROBIC BOTTLE ONLY CRITICAL RESULT CALLED TO, READ BACK BY AND VERIFIED WITH: ALLISON@0526  07/04/15 MKELLY    Culture   Final    DIPHTHEROIDS(CORYNEBACTERIUM SPECIES) Standardized susceptibility testing for this organism is not available.    Report Status 07/05/2015 FINAL  Final  Rapid strep screen     Status: None   Collection Time: 07/02/15  3:19 PM  Result Value Ref Range Status   Streptococcus, Group A Screen (Direct) NEGATIVE NEGATIVE Final    Comment: (NOTE) A Rapid Antigen test may result negative if the antigen level in the sample is below the detection level of this test. The FDA has not cleared this test as a stand-alone test therefore the rapid antigen negative result has reflexed to a Group A Strep culture.   Culture, Group  A Strep     Status: None   Collection Time: 07/02/15  3:19 PM  Result Value Ref Range Status   Strep A Culture Negative  Final    Comment: (NOTE) Performed At: [facility] [place], [place] [id number] Lindon Romp MD [id number]   Blood culture (routine x 2)     Status: None (Preliminary result)   Collection Time: 07/02/15  3:47 PM  Result Value Ref Range Status   Specimen Description BLOOD RIGHT HAND  Final   Special Requests BOTTLES DRAWN AEROBIC ONLY 1CC  Final   Culture NO GROWTH 4 DAYS  Final   Report Status PENDING  Incomplete  MRSA PCR Screening     Status: None   Collection Time: 07/02/15  9:23 PM  Result Value Ref Range Status   MRSA by PCR NEGATIVE NEGATIVE Final    Comment:        The GeneXpert MRSA Assay (FDA approved for NASAL specimens only), is one component of a comprehensive MRSA colonization surveillance program. It is not intended to diagnose MRSA infection nor to guide or monitor treatment for MRSA infections.   C difficile quick scan w PCR reflex     Status: None   Collection Time: 07/03/15  1:35 PM  Result Value Ref Range Status   C Diff antigen NEGATIVE NEGATIVE Final   C Diff toxin NEGATIVE NEGATIVE Final   C Diff interpretation Negative for toxigenic C. difficile  Final  Stool culture     Status: None   Collection Time: 07/03/15  4:29 PM  Result Value Ref Range Status   Specimen Description STOOL  Final   Special Requests NONE  Final   Culture   Final    NO SALMONELLA, SHIGELLA, CAMPYLOBACTER, YERSINIA, OR E.COLI 0157:H7 ISOLATED Performed at [facility]    Report Status 07/07/2015 FINAL  Final     Labs: Basic Metabolic Panel:  Recent Labs Lab 07/02/15 1315 07/03/15 0150 07/04/15 0335 07/06/15 0351 07/07/15 0354  NA 135 139 137 138 135  K 3.7 3.5 3.8 3.1* 4.0  CL 97* 104 106 104 104  CO2 23 23 20* 23 23  GLUCOSE 99 102* 127* 118* 98  BUN 18 13 12 7 7   CREATININE 1.64* 1.38* 1.07* 1.13*  1.17*  CALCIUM 8.7* 8.1* 7.9* 8.1* 8.1*  MG  --  1.4*  --   --   --   PHOS  --  2.8  --   --   --    Liver Function Tests:  Recent Labs Lab 07/03/15 0150  AST 30  ALT  19  ALKPHOS 57  BILITOT 0.5  PROT 4.9*  ALBUMIN 1.7*   No results for input(s): LIPASE, AMYLASE in the last 168 hours. No results for input(s): AMMONIA in the last 168 hours. CBC:  Recent Labs Lab 07/03/15 0150 07/04/15 0335 07/05/15 0333 07/06/15 0351 07/07/15 0354  WBC 11.8* 13.1* 13.8* 15.4* 16.3*  HGB 10.8* 10.7* 10.7* 10.7* 10.7*  HCT 35.0* 34.7* 33.5* 34.3* 34.9*  MCV 86.8 86.3 85.5 83.9 85.1  PLT 216 221 204 223 240   Cardiac Enzymes:  Recent Labs Lab 07/02/15 1315 07/02/15 2043 07/03/15 0150 07/03/15 0740  CKTOTAL 37*  --   --   --   TROPONINI <0.03 <0.03 <0.03 0.03   BNP: BNP (last 3 results) No results for input(s): BNP in the last 8760 hours.  ProBNP (last 3 results) No results for input(s): PROBNP in the last 8760 hours.  CBG:  Recent Labs Lab 07/05/15 2122  GLUCAP 111*       Signed:  Nakea Gouger A  [facility] 07/07/2015, 11:28 AM

## 2015-07-07 NOTE — Progress Notes (Signed)
Patient Name: [name] Date of Encounter: 07/07/2015     Principal Problem:   Atrial fibrillation with RVR (HCC) Active Problems:   MALNUTRITION   Chronic pain   General weakness   SIRS (systemic inflammatory response syndrome) (HCC)   Severe dehydration   Atrial fibrillation (HCC)   Lactic acidosis   AKI (acute kidney injury) ([place])   HTN (hypertension), benign   Failure to thrive in adult   Protein-calorie malnutrition, severe   Persistent atrial fibrillation (HCC)    SUBJECTIVE  Eating breakfast. No complaints. Feels sleepy. No CP or SOB. Bottom hurts from sitting in bed so long.   CURRENT MEDS . amoxicillin-clavulanate  1 tablet Oral Q12H  . aspirin EC  81 mg Oral Daily  . diltiazem  120 mg Oral Q12H  . enoxaparin (LOVENOX) injection  30 mg Subcutaneous Q24H  . feeding supplement  1 Container Oral TID BM  . feeding supplement (ENSURE ENLIVE)  237 mL Oral TID BM  . multivitamin with minerals  1 tablet Oral Daily  . pantoprazole  40 mg Oral Daily  . saccharomyces boulardii  250 mg Oral BID  . sertraline  100 mg Oral Daily  . sodium chloride  3 mL Intravenous Q12H    OBJECTIVE  Filed Vitals:   07/07/15 0045 07/07/15 0330 07/07/15 0400 07/07/15 0759  BP: 109/74 118/78    Pulse: 85 96    Temp: 100 F (37.8 C) 99.1 F (37.3 C)  99.5 F (37.5 C)  TempSrc: Oral Oral  Oral  Resp: 16 16 16    Height:      Weight:   123 lb 10.9 oz (56.1 kg)   SpO2: 94% 92%      Intake/Output Summary (Last 24 hours) at 07/07/15 0832 Last data filed at 07/06/15 2250  Gross per 24 hour  Intake    240 ml  Output    427 ml  Net   -187 ml   Filed Weights   07/03/15 0400 07/04/15 0355 07/07/15 0400  Weight: 117 lb 9.6 oz (53.343 kg) 117 lb 8.1 oz (53.3 kg) 123 lb 10.9 oz (56.1 kg)    PHYSICAL EXAM  General: Pleasant, NAD. elderly and frail appearing Neuro: Alert and oriented X 3. Moves all extremities spontaneously. Psych: Normal affect. HEENT:  Normal  Neck: Supple  without bruits or JVD. Lungs:  Resp regular and unlabored, CTA. Heart: irreg irreg tachy. no s3, s4, or murmurs. Abdomen: Soft, non-tender, non-distended, BS + x 4.  Extremities: No clubbing, cyanosis or edema. DP/PT/Radials 2+ and equal bilaterally.  Accessory Clinical Findings  CBC  Recent Labs  07/06/15 0351 07/07/15 0354  WBC 15.4* 16.3*  HGB 10.7* 10.7*  HCT 34.3* 34.9*  MCV 83.9 85.1  PLT 223 008   Basic Metabolic Panel  Recent Labs  07/06/15 0351 07/07/15 0354  NA 138 135  K 3.1* 4.0  CL 104 104  CO2 23 23  GLUCOSE 118* 98  BUN 7 7  CREATININE 1.13* 1.17*  CALCIUM 8.1* 8.1*    TELE aifb with RVR this AM and ~ 120 currently   Radiology/Studies  Dg Chest 2 View  07/02/2015  CLINICAL DATA:  Weakness and malaise ; atrial fibrillation EXAM: CHEST  2 VIEW COMPARISON:  June 03, 2015 FINDINGS: There is no edema or consolidation. There is slight scarring in the bases. Heart is upper normal in size with pulmonary vascularity within normal limits. There is calcification in the mitral annulus. There is a moderate-sized hiatal hernia.  No adenopathy. Bones are diffusely osteoporotic. There is stable anterior wedging of the T12 and L1 vertebral bodies. IMPRESSION: No edema or consolidation. Bones osteoporotic. Heart is upper normal in size. There is a moderate hiatal hernia. There is extensive mitral annulus calcification. Electronically Signed   By: Lowella Grip III M.D.   On: 07/02/2015 13:40   Ct Head Wo Contrast  07/02/2015  CLINICAL DATA:  Failure to thrive.  Fall, fatigue, hallucinations. EXAM: CT HEAD WITHOUT CONTRAST CT CERVICAL SPINE WITHOUT CONTRAST TECHNIQUE: Multidetector CT imaging of the head and cervical spine was performed following the standard protocol without intravenous contrast. Multiplanar CT image reconstructions of the cervical spine were also generated. COMPARISON:  None. FINDINGS: CT HEAD FINDINGS There is atrophy and chronic small vessel  disease changes. No acute intracranial abnormality. Specifically, no hemorrhage, hydrocephalus, mass lesion, acute infarction, or significant intracranial injury. No acute calvarial abnormality. CT CERVICAL SPINE FINDINGS Normal alignment. Prevertebral soft tissues are normal. Mild diffuse degenerative facet disease bilaterally. Slight disc space narrowing at C5-6 and C6-7. No fracture. No epidural or paraspinal hematoma. IMPRESSION: No acute intracranial abnormality. Atrophy, chronic small vessel disease. No acute bony abnormality in the cervical spine. Electronically Signed   By: Rolm Baptise M.D.   On: 07/02/2015 14:57   Ct Cervical Spine Wo Contrast  07/02/2015  CLINICAL DATA:  Failure to thrive.  Fall, fatigue, hallucinations. EXAM: CT HEAD WITHOUT CONTRAST CT CERVICAL SPINE WITHOUT CONTRAST TECHNIQUE: Multidetector CT imaging of the head and cervical spine was performed following the standard protocol without intravenous contrast. Multiplanar CT image reconstructions of the cervical spine were also generated. COMPARISON:  None. FINDINGS: CT HEAD FINDINGS There is atrophy and chronic small vessel disease changes. No acute intracranial abnormality. Specifically, no hemorrhage, hydrocephalus, mass lesion, acute infarction, or significant intracranial injury. No acute calvarial abnormality. CT CERVICAL SPINE FINDINGS Normal alignment. Prevertebral soft tissues are normal. Mild diffuse degenerative facet disease bilaterally. Slight disc space narrowing at C5-6 and C6-7. No fracture. No epidural or paraspinal hematoma. IMPRESSION: No acute intracranial abnormality. Atrophy, chronic small vessel disease. No acute bony abnormality in the cervical spine. Electronically Signed   By: Rolm Baptise M.D.   On: 07/02/2015 14:57    2D ECHO: 07/03/2015 LV EF: 50% -  55% Study Conclusions - Left ventricle: The cavity size was normal. Wall thickness was increased in a pattern of moderate to severe LVH.  Systolic function was normal. The estimated ejection fraction was in the range of 50% to 55%. Wall motion was normal; there were no regional wall motion abnormalities. - Aortic valve: Transvalvular velocity was within the normal range. There was no stenosis. There was no regurgitation. - Mitral valve: There was trivial regurgitation. - Left atrium: The atrium was severely dilated. - Right ventricle: The cavity size was normal. Wall thickness was normal. Systolic function was normal. - Right atrium: The atrium was severely dilated. - Atrial septum: No defect or patent foramen ovale was identified. - Tricuspid valve: There was moderate regurgitation. - Pulmonary arteries: PA peak pressure: 37 mm Hg (S). - Inferior vena cava: The vessel was normal in size. The respirophasic diameter changes were in the normal range (>= 50%), consistent with normal central venous pressure. Impressions: - Massively dilated atria with moderate to severe LVH and relatively low voltage on EKG concerning for Amyloydosis.    ASSESSMENT AND PLAN  [name] is a 81 y.o. female with a history of HTN, HLD, IBS, malnutrition who presented to [facility] ED on 07/02/15  with confusion, generalized weakness and poor PO intake and found to be in new onset A. Fib.  Atrial fibrillation with RVR-- Likely related to UTI/bacteriemia & dehydration. - New onset.CHADSVASC score of 3 (age, sex and HTN). not a good candidate for anticoagulation due to deconditioning/malnutrition, age & dementia -- would use ASA alone.  - Massive atrial dilation noted on ECHO would suggest Afib is likely to be persistent vs. permanent & unlikely to be able to establish NSR. - TSH normal - Converted to 120 mg PO BID. Some tachycardia this AM, but given her first dose at 8:13am. If HR doesn't improve may need to go up on dose or add amiodarone (HR BP is softish normal). Also question worsening infection as WBC is going up    Hypertrophic CM on Echo - ? Amyloidosis - in absence of other suggestive features, would probably consider this to be related to long-standing HTN & no infiltrative disease. Given age, co-morbiditis, would probably not consider fat pad biopsy. - No sign of diastolic HF - but would not tolerate hypovolemia well either.  MALNUTRITION, failure to thrive in adult - Albumin is 1.7 and prealbumin is 6.1, severe protein energy malnutrition.  General weakness- Suspected secondary to malnutrition and dehydration. Will hydrate and had physical therapy to eval and treat.   Severe dehydration-Providing patient with gentle IVF hydration. Will need to assess to see if altered mental status improves. As there are no focal deficits seen on physical exam and CT scan was negative.   Lactic acidosis- elevated at 2.78.  Positive blood culture 1/2 blood culture showed gram variable rods, started on vancomycin and Zosyn last night. Has some leukocytosis, urine looks okay, CXR without consolidation. Blood culture showed corynebacterium diphtheroids, likely contaminants, will discontinue Zosyn and vancomycin. Because of leukocytosis and fever patient had give Augmentin for 3 more days.  Dispo- SNF . SHe is going to [facility] PA-C Pager [phone] Patient seen and examined. I agree with the assessment and plan as detailed above. See also my additional thoughts below.   I agree with the complete assessment above. No change in cardiac therapy today.  Dola Argyle, MD, [facility] 07/07/2015 10:00 AM

## 2015-07-07 NOTE — Care Management Important Message (Signed)
Important Message  Patient Details  Name: [name] MRN: [id number] Date of Birth: [birth year]   Medicare Important Message Given:  Yes-second notification given    Nathen May 07/07/2015, 12:04 PM

## 2015-07-07 NOTE — Clinical Social Work Placement (Signed)
   CLINICAL SOCIAL WORK PLACEMENT  NOTE  Date:  07/07/2015  Patient Details  Name: [name] MRN: [id number] Date of Birth: 1925/06/20  Clinical Social Work is seeking post-discharge placement for this patient at the [place] level of care (*CSW will initial, date and re-position this form in  chart as items are completed):  Yes   Patient/family provided with [place] Work Department's list of facilities offering this level of care within the geographic area requested by the patient (or if unable, by the patient's family).  Yes   Patient/family informed of their freedom to choose among providers that offer the needed level of care, that participate in Medicare, Medicaid or managed care program needed by the patient, have an available bed and are willing to accept the patient.  Yes   Patient/family informed of [facility]'s ownership interest in [facility] and [facility], as well as of the fact that they are under no obligation to receive care at these facilities.  PASRR submitted to EDS on 07/04/15     PASRR number received on 07/06/15     Existing PASRR number confirmed on       FL2 transmitted to all facilities in geographic area requested by pt/family on 07/04/15     FL2 transmitted to all facilities within larger geographic area on       Patient informed that his/her managed care company has contracts with or will negotiate with certain facilities, including the following:        Yes   Patient/family informed of bed offers received.  Patient chooses bed at [facility]     Physician recommends and patient chooses bed at      Patient to be transferred to [facility] on 07/07/15.  Patient to be transferred to facility by [facility]     Patient family notified on 07/07/15 of transfer.  Name of family member notified:  Dawn     PHYSICIAN       Additional Comment:  Per MD patient ready for  DC to [facility], patient, patient's family, and facility notified of DC. RN given number for report. DC packet on chart. Ambulance transport requested for patient for 2:30PM. CSW signing off.   _______________________________________________ Liz Beach MSW, [place], [place], [phone]

## 2015-07-07 NOTE — Progress Notes (Signed)
Attempted to call report to [facility].  Ruben Reason, RN

## 2015-07-07 NOTE — Progress Notes (Signed)
Physical Therapy Treatment Patient Details Name: [name] MRN: [id number] DOB: Mar 13, 1925 Today's Date: 07/07/2015    History of Present Illness Patient is a 81 year old, Caucasian female with past medical history of cervical cancer status post hysterectomy, chronic pain who presents for generalized weakness and lack of appetite that have progressively worsened in the last 3 weeks. PMHx: HTN, IBS, Afib    PT Comments    Pt unable to stand with PT today due to not feeling well.  Con't to recommend SNF.  Follow Up Recommendations  SNF;Supervision/Assistance - 24 hour     Equipment Recommendations  None recommended by PT    Recommendations for Other Services       Precautions / Restrictions Precautions Precautions: Fall Restrictions Weight Bearing Restrictions: No    Mobility  Bed Mobility Overal bed mobility: Needs Assistance Bed Mobility: Supine to Sit     Supine to sit: Mod assist     General bed mobility comments: Pt came to side of bed with MOD A with use of bed pad to get hips turned, but pt initiating with legs.  Transfers Overall transfer level: Needs assistance Equipment used: 2 person hand held assist Transfers: Sit to/from Stand Sit to Stand: Total assist         General transfer comment: Attempted to stand at side of bed to get onto commode, but pt resistive and unable to get pt to stand up.  Pt then stated she was dizzy and naueous.  Pt HR jumping from 109-150, but not holding still. Pt  returned supine with MOD A.  Rolling with MIN A to place bed pan.  Ambulation/Gait                 Stairs            Wheelchair Mobility    Modified Rankin (Stroke Patients Only)       Balance     Sitting balance-Leahy Scale: Poor Sitting balance - Comments: Leaning R                            Cognition Arousal/Alertness: Awake/alert Behavior During Therapy: Flat affect Overall Cognitive Status: No family/caregiver present  to determine baseline cognitive functioning                      Exercises      General Comments        Pertinent Vitals/Pain      Home Living                      Prior Function            PT Goals (current goals can now be found in the care plan section) Acute Rehab PT Goals PT Goal Formulation: With patient/family Time For Goal Achievement: 07/17/15 Potential to Achieve Goals: Fair Progress towards PT goals: Not progressing toward goals - comment    Frequency  Min 3X/week    PT Plan Current plan remains appropriate    Co-evaluation             End of Session   Activity Tolerance: Patient limited by fatigue;Treatment limited secondary to medical complications (Comment) (dizzy and nauseous) Patient left: in bed;with call bell/phone within reach;with bed alarm set     Time: 5643-3295 PT Time Calculation (min) (ACUTE ONLY): 19 min  Charges:  $Therapeutic Activity: 8-22 mins  G Codes:      Amal Saiki LUBECK 07/07/2015, 12:36 PM

## 2015-07-22 ENCOUNTER — Other Ambulatory Visit: Payer: Self-pay | Admitting: Internal Medicine

## 2015-07-22 NOTE — Telephone Encounter (Signed)
Please advise, I do not see on pt's current med list.

## 2015-07-22 NOTE — Telephone Encounter (Signed)
She received a rx for hydrocodone upon d/c from the hospital - she should be taking that for pain not the tramadol anymore.

## 2015-08-07 ENCOUNTER — Telehealth: Payer: Self-pay | Admitting: Internal Medicine

## 2015-08-07 ENCOUNTER — Other Ambulatory Visit: Payer: Self-pay | Admitting: Internal Medicine

## 2015-08-07 NOTE — Telephone Encounter (Signed)
[facility] is requesting a fill of omeprazole (PRILOSEC) 20 MG capsule [id number] for 90 days supply

## 2015-08-15 ENCOUNTER — Other Ambulatory Visit: Payer: Self-pay | Admitting: Internal Medicine

## 2015-08-15 NOTE — Telephone Encounter (Signed)
Please advise 

## 2015-10-22 DEATH — deceased

## 2015-10-24 ENCOUNTER — Other Ambulatory Visit: Payer: Self-pay | Admitting: Internal Medicine

## 2015-10-24 ENCOUNTER — Other Ambulatory Visit: Payer: Self-pay

## 2015-10-24 MED ORDER — SERTRALINE HCL 100 MG PO TABS: ORAL_TABLET | ORAL | Status: AC

## 2015-10-24 NOTE — Telephone Encounter (Signed)
Please advise 

## 2015-10-24 NOTE — Telephone Encounter (Signed)
A user error has taken place.
